# Patient Record
Sex: Male | Born: 1955
Health system: Southern US, Community
[De-identification: ages and names within clinical notes are randomized; demographics above are authoritative.]

## PROBLEM LIST (undated history)

## (undated) DIAGNOSIS — H53009 Unspecified amblyopia, unspecified eye: Secondary | ICD-10-CM

## (undated) DIAGNOSIS — E7800 Pure hypercholesterolemia, unspecified: Secondary | ICD-10-CM

## (undated) DIAGNOSIS — R42 Dizziness and giddiness: Secondary | ICD-10-CM

## (undated) DIAGNOSIS — K219 Gastro-esophageal reflux disease without esophagitis: Secondary | ICD-10-CM

## (undated) DIAGNOSIS — K2270 Barrett's esophagus without dysplasia: Secondary | ICD-10-CM

## (undated) DIAGNOSIS — N529 Male erectile dysfunction, unspecified: Secondary | ICD-10-CM

## (undated) DIAGNOSIS — Z973 Presence of spectacles and contact lenses: Secondary | ICD-10-CM

## (undated) HISTORY — DX: Gastro-esophageal reflux disease without esophagitis: K21.9

## (undated) HISTORY — PX: HAND SURGERY: SHX662

## (undated) HISTORY — DX: Male erectile dysfunction, unspecified: N52.9

## (undated) HISTORY — DX: Barrett's esophagus without dysplasia: K22.70

## (undated) HISTORY — DX: Pure hypercholesterolemia, unspecified: E78.00

---

## 1997-01-08 HISTORY — PX: ESOPHAGOGASTRODUODENOSCOPY: SHX1529

## 2001-03-08 ENCOUNTER — Encounter: Payer: Self-pay | Admitting: Family Medicine

## 2001-03-08 LAB — CONVERTED CEMR LAB
RBC count: 5 10*6/uL
TSH: 2.991 microintl units/mL

## 2001-03-09 ENCOUNTER — Encounter: Payer: Self-pay | Admitting: Family Medicine

## 2001-03-09 HISTORY — PX: OTHER SURGICAL HISTORY: SHX169

## 2001-03-09 LAB — CONVERTED CEMR LAB: Blood Glucose, Fasting: 85 mg/dL

## 2001-08-16 ENCOUNTER — Encounter: Payer: Self-pay | Admitting: Family Medicine

## 2001-08-16 LAB — CONVERTED CEMR LAB: Blood Glucose, Fasting: 89 mg/dL

## 2002-08-23 ENCOUNTER — Encounter: Payer: Self-pay | Admitting: Family Medicine

## 2002-08-23 LAB — CONVERTED CEMR LAB
RBC count: 4.91 10*6/uL
WBC, blood: 8 10*3/uL

## 2003-08-28 ENCOUNTER — Encounter: Payer: Self-pay | Admitting: Family Medicine

## 2003-08-28 LAB — CONVERTED CEMR LAB: Blood Glucose, Fasting: 102 mg/dL

## 2004-09-07 ENCOUNTER — Ambulatory Visit: Payer: Self-pay | Admitting: Family Medicine

## 2004-12-25 ENCOUNTER — Ambulatory Visit: Payer: Self-pay | Admitting: Family Medicine

## 2004-12-25 LAB — CONVERTED CEMR LAB
RBC count: 5 10*6/uL
WBC, blood: 10.1 10*3/uL

## 2004-12-28 ENCOUNTER — Encounter: Admission: RE | Admit: 2004-12-28 | Discharge: 2004-12-28 | Payer: Self-pay | Admitting: Family Medicine

## 2005-01-01 ENCOUNTER — Encounter: Admission: RE | Admit: 2005-01-01 | Discharge: 2005-01-01 | Payer: Self-pay | Admitting: Family Medicine

## 2005-01-06 ENCOUNTER — Ambulatory Visit (HOSPITAL_COMMUNITY): Admission: RE | Admit: 2005-01-06 | Discharge: 2005-01-06 | Payer: Self-pay | Admitting: Surgery

## 2005-01-06 HISTORY — PX: FEMORAL HERNIA REPAIR: SUR1179

## 2005-01-18 ENCOUNTER — Ambulatory Visit: Payer: Self-pay | Admitting: Family Medicine

## 2005-01-18 LAB — CONVERTED CEMR LAB
Blood Glucose, Fasting: 98 mg/dL
PSA: 0.32 ng/mL

## 2005-01-21 ENCOUNTER — Ambulatory Visit: Payer: Self-pay | Admitting: Family Medicine

## 2005-03-05 ENCOUNTER — Ambulatory Visit: Payer: Self-pay | Admitting: Family Medicine

## 2005-04-15 ENCOUNTER — Ambulatory Visit: Payer: Self-pay | Admitting: Family Medicine

## 2005-04-20 ENCOUNTER — Ambulatory Visit: Payer: Self-pay | Admitting: Family Medicine

## 2005-07-21 ENCOUNTER — Ambulatory Visit: Payer: Self-pay | Admitting: Family Medicine

## 2005-09-15 ENCOUNTER — Ambulatory Visit: Payer: Self-pay | Admitting: Family Medicine

## 2006-01-14 ENCOUNTER — Ambulatory Visit: Payer: Self-pay | Admitting: Family Medicine

## 2006-01-14 LAB — CONVERTED CEMR LAB: TSH: 2.04 microintl units/mL

## 2006-01-18 ENCOUNTER — Ambulatory Visit: Payer: Self-pay | Admitting: Family Medicine

## 2006-01-19 ENCOUNTER — Ambulatory Visit: Payer: Self-pay

## 2006-06-03 ENCOUNTER — Ambulatory Visit: Payer: Self-pay | Admitting: Family Medicine

## 2007-03-03 ENCOUNTER — Telehealth: Payer: Self-pay | Admitting: Family Medicine

## 2007-09-15 ENCOUNTER — Ambulatory Visit: Payer: Self-pay | Admitting: Family Medicine

## 2007-11-24 ENCOUNTER — Encounter: Payer: Self-pay | Admitting: Family Medicine

## 2007-11-24 DIAGNOSIS — K2270 Barrett's esophagus without dysplasia: Secondary | ICD-10-CM | POA: Insufficient documentation

## 2007-11-24 DIAGNOSIS — F528 Other sexual dysfunction not due to a substance or known physiological condition: Secondary | ICD-10-CM | POA: Insufficient documentation

## 2007-11-24 DIAGNOSIS — E7800 Pure hypercholesterolemia, unspecified: Secondary | ICD-10-CM | POA: Insufficient documentation

## 2007-11-29 ENCOUNTER — Ambulatory Visit: Payer: Self-pay | Admitting: Family Medicine

## 2007-11-29 LAB — CONVERTED CEMR LAB
BUN: 12 mg/dL (ref 6–23)
CO2: 29 meq/L (ref 19–32)
Calcium: 9.3 mg/dL (ref 8.4–10.5)
GFR calc Af Amer: 102 mL/min
GFR calc non Af Amer: 84 mL/min
LDL Cholesterol: 67 mg/dL (ref 0–99)
PSA: 0.31 ng/mL (ref 0.10–4.00)
Potassium: 4.1 meq/L (ref 3.5–5.1)
TSH: 2.32 microintl units/mL (ref 0.35–5.50)
Total CHOL/HDL Ratio: 2.7
Triglycerides: 57 mg/dL (ref 0–149)
VLDL: 11 mg/dL (ref 0–40)

## 2007-12-06 ENCOUNTER — Ambulatory Visit: Payer: Self-pay | Admitting: Family Medicine

## 2008-01-05 ENCOUNTER — Ambulatory Visit: Payer: Self-pay | Admitting: Family Medicine

## 2008-01-08 ENCOUNTER — Encounter (INDEPENDENT_AMBULATORY_CARE_PROVIDER_SITE_OTHER): Payer: Self-pay | Admitting: *Deleted

## 2008-03-26 ENCOUNTER — Ambulatory Visit: Payer: Self-pay | Admitting: Family Medicine

## 2009-05-15 ENCOUNTER — Telehealth: Payer: Self-pay | Admitting: Family Medicine

## 2009-08-18 ENCOUNTER — Encounter (INDEPENDENT_AMBULATORY_CARE_PROVIDER_SITE_OTHER): Payer: Self-pay | Admitting: *Deleted

## 2009-08-19 ENCOUNTER — Ambulatory Visit: Payer: Self-pay | Admitting: Family Medicine

## 2009-08-19 LAB — CONVERTED CEMR LAB
Alkaline Phosphatase: 46 units/L (ref 39–117)
Basophils Absolute: 0.1 10*3/uL (ref 0.0–0.1)
Bilirubin, Direct: 0 mg/dL (ref 0.0–0.3)
CO2: 29 meq/L (ref 19–32)
Calcium: 9.4 mg/dL (ref 8.4–10.5)
Creatinine, Ser: 1.2 mg/dL (ref 0.4–1.5)
GFR calc non Af Amer: 67.53 mL/min (ref >60)
HCT: 43.9 % (ref 39.0–52.0)
HDL: 46.1 mg/dL (ref >39.00)
Lymphs Abs: 3.1 10*3/uL (ref 0.7–4.0)
Monocytes Absolute: 0.5 10*3/uL (ref 0.1–1.0)
Monocytes Relative: 6.3 % (ref 3.0–12.0)
Neutrophils Relative %: 51.7 % (ref 43.0–77.0)
PSA: 0.34 ng/mL (ref 0.10–4.00)
Platelets: 241 10*3/uL (ref 150.0–400.0)
RDW: 12.1 % (ref 11.5–14.6)
Sodium: 142 meq/L (ref 135–145)
TSH: 1.8 microintl units/mL (ref 0.35–5.50)
Total Bilirubin: 0.7 mg/dL (ref 0.3–1.2)
Total CHOL/HDL Ratio: 3
Total Protein: 7.6 g/dL (ref 6.0–8.3)
Triglycerides: 101 mg/dL (ref 0.0–149.0)
VLDL: 20.2 mg/dL (ref 0.0–40.0)
WBC: 7.9 10*3/uL (ref 4.5–10.5)

## 2009-08-25 ENCOUNTER — Ambulatory Visit: Payer: Self-pay | Admitting: Family Medicine

## 2009-10-29 ENCOUNTER — Ambulatory Visit: Payer: Self-pay | Admitting: Family Medicine

## 2009-10-29 LAB — FECAL OCCULT BLOOD, GUAIAC: Fecal Occult Blood: NEGATIVE

## 2009-10-29 LAB — CONVERTED CEMR LAB: OCCULT 2: NEGATIVE

## 2009-10-30 ENCOUNTER — Encounter: Payer: Self-pay | Admitting: Family Medicine

## 2009-11-18 ENCOUNTER — Telehealth: Payer: Self-pay | Admitting: Family Medicine

## 2009-11-18 ENCOUNTER — Emergency Department (HOSPITAL_COMMUNITY): Admission: EM | Admit: 2009-11-18 | Discharge: 2009-11-19 | Payer: Self-pay | Admitting: Emergency Medicine

## 2009-12-02 HISTORY — PX: OTHER SURGICAL HISTORY: SHX169

## 2009-12-04 ENCOUNTER — Ambulatory Visit: Payer: Self-pay | Admitting: Cardiology

## 2009-12-04 DIAGNOSIS — R079 Chest pain, unspecified: Secondary | ICD-10-CM | POA: Insufficient documentation

## 2009-12-04 DIAGNOSIS — K219 Gastro-esophageal reflux disease without esophagitis: Secondary | ICD-10-CM | POA: Insufficient documentation

## 2009-12-09 ENCOUNTER — Telehealth (INDEPENDENT_AMBULATORY_CARE_PROVIDER_SITE_OTHER): Payer: Self-pay

## 2009-12-10 ENCOUNTER — Ambulatory Visit: Payer: Self-pay

## 2009-12-10 ENCOUNTER — Encounter (HOSPITAL_COMMUNITY): Admission: RE | Admit: 2009-12-10 | Discharge: 2010-02-04 | Payer: Self-pay | Admitting: Cardiovascular Disease

## 2009-12-10 ENCOUNTER — Ambulatory Visit: Payer: Self-pay | Admitting: Cardiovascular Disease

## 2009-12-17 ENCOUNTER — Ambulatory Visit: Payer: Self-pay | Admitting: Cardiology

## 2010-06-09 ENCOUNTER — Encounter (INDEPENDENT_AMBULATORY_CARE_PROVIDER_SITE_OTHER): Payer: Self-pay | Admitting: *Deleted

## 2010-12-01 NOTE — Assessment & Plan Note (Signed)
Summary: EA:VWUJW pain/pt seen in ed/records on file/lg   Visit Type:  Initial Consult Primary Provider:  Shaune Leeks MD  CC:  chest pain.  History of Present Illness: 55 yo with history of hyperlipidemia and strong family history of premature CAD presents for evaluation of chest pain.  Thomas Church was carrying a laser printer down a flight of stairs on a hand truck when he developed a cramp-like left-sided chest pain.  It gradually got worse and he became diaphoretic.  His left arm felt weak.  The pain resolved after about 5 minutes of rest.  He went to the ER, where cardiac enzymes were negative.  It was recommended that he stay overnight but he went home.  He has had no chest pain since that time.  He has excellent exercise tolerance with no exertional dyspnea.  Of note, he has had cramps in his chest in the past that were attributed to statin use and resolved with quinine.  This episode of pain was worse than anything he had had before.  He has actually been tolerating Vytorin without any problems.    ECG: NSR, normal  Labs (10/10): K 4.2, creatinine 1.2, LDL 75, HDL 46, TSH normal  Current Medications (verified): 1)  Vytorin 10-80 Mg Tabs (Ezetimibe-Simvastatin) .Marland Kitchen.. 1 Tablet At Bedtime 2)  Prilosec Otc 20 Mg  Tbec (Omeprazole Magnesium) .Marland Kitchen.. 1 Daily By Mouth  Allergies (verified): No Known Drug Allergies  Past History:  Past Medical History: 1. CHEST PAIN (ICD-786.50): exercise echo in 2002 was normal per patient's report.  2. HYPERCHOLESTEROLEMIA (ICD-272.0) 3. ACID REFLUX DISEASE (ICD-530.81) 4. ERECTILE DYSFUNCTION (ICD-302.72) 5. BARRETTS ESOPHAGUS (ICD-530.85)  Family History: Father: DECEASED 26 YOA MI; CABG X2 ; AAA VARICES//+HIV 2ND TO 1ST CABG.  Father's first MI was at age 54.  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   Vital Signs:  Patient profile:   55 year old male Height:      69 inches Weight:      202 pounds BMI:     29.94 Pulse rate:   74 / minute Pulse rhythm:   regular BP sitting:   120 / 78  (left arm)  Vitals Entered By: Laurance Flatten CMA (December 04, 2009 11:35 AM)  Physical Exam  General:  Well developed, well nourished, in no acute distress. Head:  normocephalic and atraumatic Nose:  no deformity, discharge, inflammation, or lesions Mouth:  Teeth, gums and palate normal. Oral mucosa normal. Neck:  Neck supple, no JVD. No masses, thyromegaly or abnormal cervical nodes. Lungs:  Clear bilaterally to auscultation and percussion. Heart:  Non-displaced PMI, chest non-tender; regular rate and rhythm, S1, S2 without murmurs, rubs or gallops. Carotid upstroke normal, no bruit. Pedals normal pulses. No edema, no varicosities. Abdomen:  Bowel sounds positive; abdomen soft and non-tender without masses, organomegaly, or hernias noted. No hepatosplenomegaly. Msk:  Back normal, normal gait. Muscle strength and tone normal. Extremities:  No clubbing or cyanosis. Neurologic:  Alert and oriented x 3. Skin:  Intact without lesions or rashes. Psych:  Normal affect.   Impression & Recommendations:  Problem # 1:  CHEST PAIN (ICD-786.50) Patient had an episode of "chest cramping" while maneuvering a laser printer on a handtruck down a flight of steps.  This certainly could have been musculoskeletal and has not been repeated since.  His ECG is normal.  However, given his strong family history of premature CAD (father had  his first MI at 47), I think that we are certainly obligated to look for evidence of coronary ischemia.  I will set him up for an ETT-myoview.  He should continue his Vytorin and start on ASA 81 mg daily.    Other Orders: Nuclear Stress Test (Nuc Stress Test)  Patient Instructions: 1)  Your physician recommends that you schedule a follow-up appointment in: 2 weeks 2)  Your physician has recommended you make the following change in your medication: please take 81mg  asa everyday 3)  Your physician has requested that you have an exercise stress myoview.  For further information please visit [email or web address]  Please follow instruction sheet, as given.

## 2010-12-01 NOTE — Progress Notes (Signed)
Summary: Left arm pain and weakness  Phone Note Call from Patient Call back at [phone]   Caller: Patient Call For: Dr. Milinda Antis Summary of Call: Pt said at 2:00pm today had very painful cramp in upper muscle of left arm. Left arm got weak, pt was flushed and lightheaded. Pt was not sure if had chest pain or not. pt had not eaten all day. Pt ate at 3pm withno real differnce in how he felt. Now pt's left arm feels very sore and still feels weak. Pt appears pale and washed out in face. Pt's wife said pt not talking like himself and pt said that was because he had congestion in his throat. Dr Nandini Bogdanski said for pt to go to ER for evaluation. Pt and pt's wife said they would go to [facility] ER now. Initial call taken by: Lewanda Rife LPN,  November 18, 2009 5:05 PM

## 2010-12-01 NOTE — Assessment & Plan Note (Signed)
Summary: 2WK F/U    Primary Provider:  Shaune Leeks MD  CC:  pt follow up on nuclear test.  He is not taking an ASA everyday due to indigestion.  Marland Kitchen  History of Present Illness: 55 yo with history of hyperlipidemia and strong family history of premature CAD returns for evaluation of chest pain.  Thomas Church was carrying a laser printer down a flight of stairs on a hand truck when he developed a cramp-like left-sided chest pain.  It gradually got worse and he became diaphoretic.  His left arm felt weak.  The pain resolved after about 5 minutes of rest.  He went to the ER, where cardiac enzymes were negative.  It was recommended that he stay overnight but he went home.  He has had no chest pain since that time.  He has excellent exercise tolerance with no exertional dyspnea.  Of note, he has had cramps in his chest in the past that were attributed to statin use and resolved with quinine.  This episode of pain was worse than anything he had had before.  He has actually been tolerating Vytorin without any problems.    He did an ETT-myoview on which he exercised for 8'35" with no chest pain.  He stopped due to fatigue.  No ischemia or infarction.    Labs (10/10): K 4.2, creatinine 1.2, LDL 75, HDL 46, TSH normal  Current Medications (verified): 1)  Vytorin 10-80 Mg Tabs (Ezetimibe-Simvastatin) .Marland Kitchen.. 1 Tablet At Bedtime 2)  Prilosec Otc 20 Mg  Tbec (Omeprazole Magnesium) .Marland Kitchen.. 1 Daily By Mouth  Allergies (verified): No Known Drug Allergies  Past History:  Past Medical History: 1. CHEST PAIN (ICD-786.50): exercise echo in 2002 was normal per patient's report.  ETT-myoview (2/11): 8'35", no chest pain, no ischemic ECG changes, perfusion images showed no ischemia or infarction.  2. HYPERCHOLESTEROLEMIA (ICD-272.0) 3. ACID REFLUX DISEASE (ICD-530.81) 4. ERECTILE DYSFUNCTION (ICD-302.72) 5. BARRETTS ESOPHAGUS (ICD-530.85)  Family History: Reviewed history from 12/04/2009 and no changes  required. Father: DECEASED 62 YOA MI; CABG X2 ; AAA VARICES//+HIV 2ND TO 1ST CABG.  Father's first MI was at age 5.  Mother: A 2  GIANT CELL  ARTERITIS   CATARACTS BROTHER  A 55 Colitis CV: + FATHER CABG X2 ;MI HBP: + FATHER DM: + UNCLE GOUT/ARTHRITIS: PROSTATE CANCER:  BREAST CANCER: + AUNT OVARIAN/UTERINE CANCER: NEGATIVE COLON CANCER: DEPRESSION: NEGATIVE ETOH ABUSE: + UNCLE OTHER: + STROKE ; GREAT AUNT  Social History: Reviewed history from 12/04/2009 and no changes required. 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 Neck:  Neck supple, no JVD. No masses, thyromegaly or abnormal cervical nodes. Lungs:  Clear bilaterally to auscultation and percussion. Heart:  Non-displaced PMI, chest non-tender; regular rate and rhythm, S1, S2 without murmurs, rubs or gallops. Carotid upstroke normal, no bruit. Pedals normal pulses. No edema, no varicosities. Abdomen:  Bowel sounds positive; abdomen soft and  non-tender without masses, organomegaly, or hernias noted. No hepatosplenomegaly. Extremities:  No clubbing or cyanosis. Neurologic:  Alert and oriented x 3. Psych:  Normal affect.   Impression & Recommendations:  Problem # 1:  CHEST PAIN (ICD-786.50) I suspect that this was noncardiac.  It may have been due to a muscle cramp while trying to move heavy machinery.  Myoview was low risk with no evidence of ischemia and infarction.  He needs to take ASA 81 mg daily.  I recommended that he take it with food as it causes  discomfort when he takes it on an empty stomach.    Patient Instructions: 1)  We will see you back on an as needed basis.

## 2010-12-01 NOTE — Letter (Signed)
Summary: Nadara Eaton letter  [place] at [facility]  [address] [place], [place] [address]   Phone: [phone]  Fax: [phone]       06/09/2010 MRN: [id number]  [facility] [address] [place], [place]  [address]  Dear Mr. [facility] [place] and [facility] announce the retirement of Arta Silence, M.D., from full-time practice at the [facility] office effective April 30, 2010 and his plans of returning part-time.  It is important to Dr. Hetty Ely and to our practice that you understand that [facility] has seven physicians in our office for your health care needs.  We will continue to offer the same exceptional care that you have today.    Dr. Hetty Ely has spoken to many of you about his plans for retirement and returning part-time in the fall.   We will continue to work with you through the transition to schedule appointments for you in the office and meet the high standards that [facility] is committed to.   Again, it is with great pleasure that we share the news that Dr. Hetty Ely will return to [facility] in October of 2011 with a reduced schedule.    If you have any questions, or would like to request an appointment with one of our physicians, please call us at [phone] and press the option for Scheduling an appointment.  We take pleasure in providing you with excellent patient care and look forward to seeing you at your next office visit.  Our [facility] Physicians are:  Tillman Abide, M.D. Laurita Quint, M.D. Roxy Manns, M.D. Kerby Nora, M.D. Hannah Beat, M.D. Ruthe Mannan, M.D. We proudly welcomed Raechel Ache, M.D. and Eustaquio Boyden, M.D. to the practice in July/August 2011.  Sincerely,  [facility]

## 2010-12-01 NOTE — Progress Notes (Signed)
Summary: Nuc. Pre-Procedure  Phone Note Outgoing Call Call back at [phone]   Call placed by: Irean Hong, RN,  December 09, 2009 2:42 PM Summary of Call: Reviewed information on Myoview Information Sheet (see scanned document for further details).  Spoke with patient.     Nuclear Med Background Indications for Stress Test: Evaluation for Ischemia  Indications Comments: 11/18/09 ED: CP, (-) enzymes.  History: Echo  History Comments: 6/02 Stress Echo NL.  Symptoms: Chest Pain with Exertion, Diaphoresis    Nuclear Pre-Procedure Cardiac Risk Factors: Carotid Disease, Family History - CAD, Lipids Height (in): 69

## 2010-12-01 NOTE — Assessment & Plan Note (Signed)
Summary: Cardiology Nuclear Study  Nuclear Med Background Indications for Stress Test: Evaluation for Ischemia  Indications Comments: 11/18/09 ED: CP, (-) enzymes.  History: Echo  History Comments: 6/02 Stress Echo NL.  Symptoms: Chest Pain with Exertion, Diaphoresis    Nuclear Pre-Procedure Cardiac Risk Factors: Carotid Disease, Family History - CAD, Lipids Caffeine/Decaff Intake: None NPO After: 9:30 PM Lungs: clear IV 0.9% NS with Angio Cath: 18g     IV Site: (R) AC IV Started by: Stanton Kidney EMT-P Chest Size (in) 44     Height (in): 69 Weight (lb): 200 BMI: 29.64  Nuclear Med Study 1 or 2 day study:  1 day     Stress Test Type:  Stress Reading MD:  Charlton Haws, MD     Referring MD:  D.Mclean Resting Radionuclide:  Technetium 56m Tetrofosmin     Resting Radionuclide Dose:  11.0 mCi  Stress Radionuclide:  Technetium 67m Tetrofosmin     Stress Radionuclide Dose:  33.0 mCi   Stress Protocol Exercise Time (min):  8:35 min     Max HR:  176 bpm     Predicted Max HR:  168 bpm  Max Systolic BP: 177 mm Hg     Percent Max HR:  104.76 %     METS: 10.4 Rate Pressure Product:  [address]    Stress Test Technologist:  Milana Na EMT-P     Nuclear Technologist:  Burna Mortimer Deal RT-N  Rest Procedure  Myocardial perfusion imaging was performed at rest 45 minutes following the intravenous administration of Myoview Technetium 84m Tetrofosmin.  Stress Procedure  The patient exercised for 8:35. The patient stopped due to fatigue, leg pain, and denied any chest pain.  There were no significant ST-T wave changes.  Myoview was injected at peak exercise and myocardial perfusion imaging was performed after a brief delay.  QPS Raw Data Images:  Normal; no motion artifact; normal heart/lung ratio. Stress Images:  NI: Uniform and normal uptake of tracer in all myocardial segments. Rest Images:  Normal homogeneous uptake in all areas of the myocardium. Subtraction (SDS):  Normal Transient  Ischemic Dilatation:  .91  (Normal <1.22)  Lung/Heart Ratio:  .29  (Normal <0.45)  Quantitative Gated Spect Images QGS EDV:  72 ml QGS ESV:  27 ml QGS EF:  62 % QGS cine images:  normal  Findings Normal nuclear study      Overall Impression  Exercise Capacity: Good exercise capacity. BP Response: Normal blood pressure response. Clinical Symptoms: Leg pain ECG Impression: No significant ST segment change suggestive of ischemia. Overall Impression: Normal stress nuclear study. Overall Impression Comments: Normal  Appended Document: Cardiology Nuclear Study normal study  Appended Document: Cardiology Nuclear Study pt aware./cy

## 2010-12-10 ENCOUNTER — Encounter: Payer: Self-pay | Admitting: Family Medicine

## 2010-12-10 ENCOUNTER — Ambulatory Visit (INDEPENDENT_AMBULATORY_CARE_PROVIDER_SITE_OTHER): Payer: BC Managed Care – PPO | Admitting: Family Medicine

## 2010-12-10 DIAGNOSIS — R252 Cramp and spasm: Secondary | ICD-10-CM

## 2010-12-10 DIAGNOSIS — R635 Abnormal weight gain: Secondary | ICD-10-CM

## 2010-12-10 DIAGNOSIS — K219 Gastro-esophageal reflux disease without esophagitis: Secondary | ICD-10-CM

## 2010-12-10 DIAGNOSIS — E7800 Pure hypercholesterolemia, unspecified: Secondary | ICD-10-CM

## 2010-12-17 NOTE — Assessment & Plan Note (Signed)
Summary: REFILL MEDICATION/CLE   [employer]   Vital Signs:  Patient profile:   55 year old male Weight:      206.75 pounds BMI:     30.64 Temp:     98.0 degrees F oral Pulse rate:   76 / minute Pulse rhythm:   regular BP sitting:   110 / 70  (left arm) Cuff size:   large  Vitals Entered By: Sydell Axon LPN (December 10, 2010 10:13 AM) CC: Refill medicaiton, fasting for lab work   History of Present Illness: Thomas Church here for med reefill. He hasn't had PE since 10/10. He gets cramps occas, had signif episode in chesat and was evaluated for chest pain.  He gets winded sooner.  He has no GERD if taking his Prilosec regularly.  Problems Prior to Update: 1)  Chest Pain  (ICD-786.50) 2)  Hypercholesterolemia  (ICD-272.0) 3)  Acid Reflux Disease  (ICD-530.81) 4)  Special Screening Malig Neoplasms Other Sites  (ICD-V76.49) 5)  Health Maintenance Exam  (ICD-V70.0) 6)  Special Screening Malignant Neoplasm of Prostate  (ICD-V76.44) 7)  Erectile Dysfunction  (ICD-302.72) 8)  Barretts Esophagus  (ICD-530.85)  Medications Prior to Update: 1)  Vytorin 10-80 Mg Tabs (Ezetimibe-Simvastatin) .Marland Kitchen.. 1 Tablet At Bedtime 2)  Prilosec Otc 20 Mg  Tbec (Omeprazole Magnesium) .Marland Kitchen.. 1 Daily By Mouth  Current Medications (verified): 1)  Vytorin 10-80 Mg Tabs (Ezetimibe-Simvastatin) .Marland Kitchen.. 1 Tablet At Bedtime 2)  Prilosec Otc 20 Mg  Tbec (Omeprazole Magnesium) .Marland Kitchen.. 1 Daily By Mouth  Allergies: No Known Drug Allergies  Physical Exam  General:  Well-developed,well-nourished,in no acute distress; alert,appropriate and cooperative throughout examination, mildly overweight. Head:  Normocephalic and atraumatic without obvious abnormalities. No apparent alopecia or balding. Sinuses nontender. Eyes:  Conjunctiva clear bilaterally.  Ears:  External ear exam shows no significant lesions or deformities.  Otoscopic examination reveals clear canals, tympanic membranes are intact bilaterally without bulging, retraction,  inflammation or discharge. Hearing is grossly normal bilaterally. Nose:  External nasal examination shows no deformity or inflammation. Nasal mucosa are pink and moist without lesions or exudates. Mucose membr nml. Mouth:  Oral mucosa and oropharynx without lesions or exudates.  Teeth in good repair. 2+ tonsils w/o inflamm. Neck:  No deformities, masses, or tenderness noted. Lungs:  Normal respiratory effort, chest expands symmetrically. Lungs are clear to auscultation, no crackles or wheezes. Heart:  Normal rate and regular rhythm. S1 and S2 normal without gallop, murmur, click, rub or other extra sounds.   Impression & Recommendations:  Problem # 1:  HYPERCHOLESTEROLEMIA (ICD-272.0) Assessment Unchanged  Refilled med. Make appt for PE and will check labs then. Tolerating well. His updated medication list for this problem includes:    Vytorin 10-80 Mg Tabs (Ezetimibe-simvastatin) .Marland Kitchen... 1 tablet at bedtime  Labs Reviewed: SGOT: 25 (08/19/2009)   SGPT: 35 (08/19/2009)   HDL:46.10 (08/19/2009), 46.5 (11/29/2007)  LDL:75 (08/19/2009), 67 (11/29/2007)  Chol:141 (08/19/2009), 125 (11/29/2007)  Trig:101.0 (08/19/2009), 57 (11/29/2007)  Problem # 2:  ACID REFLUX DISEASE (ICD-530.81) Assessment: Unchanged Stable on meds. Jogging in jug may help. His updated medication list for this problem includes:    Prilosec Otc 20 Mg Tbec (Omeprazole magnesium) .Marland Kitchen... 1 daily by mouth  Problem # 3:  MUSCLE CRAMPS (ICD-729.82) Assessment: New Try jogging in jug. Will check labs in future.  Problem # 4:  WEIGHT GAIN (ICD-783.1) Assessment: New Is on [place]. Discussed parameters of protein intake. Encouraged exercise.  Complete Medication List: 1)  Vytorin 10-80 Mg Tabs (Ezetimibe-simvastatin) .Marland KitchenMarland KitchenMarland Kitchen  1 tablet at bedtime 2)  Prilosec Otc 20 Mg Tbec (Omeprazole magnesium) .Marland Kitchen.. 1 daily by mouth  Patient Instructions: 1)  RTC for PE at next convenient appt.  2)  Look into jogging in jug.   Prescriptions: VYTORIN 10-80 MG TABS (EZETIMIBE-SIMVASTATIN) 1 tablet at bedtime  #30 Tablet x 12   Entered and Authorized by:   Shaune Leeks MD   Signed by:   Shaune Leeks MD on 12/10/2010   Method used:   Electronically to        [facility]* (retail)       [address]       [place], [place]  [address]       Ph: [phone]       Fax: [phone]   RxID:   [id number]    Orders Added: 1)  Est. Patient Level III [84132]    Current Allergies (reviewed today): No known allergies

## 2011-01-17 LAB — DIFFERENTIAL
Basophils Absolute: 0.1 10*3/uL (ref 0.0–0.1)
Basophils Relative: 1 % (ref 0–1)
Eosinophils Relative: 1 % (ref 0–5)
Monocytes Absolute: 0.5 10*3/uL (ref 0.1–1.0)

## 2011-01-17 LAB — POCT I-STAT, CHEM 8
Calcium, Ion: 1.18 mmol/L (ref 1.12–1.32)
HCT: 48 % (ref 39.0–52.0)
Hemoglobin: 16.3 g/dL (ref 13.0–17.0)
TCO2: 30 mmol/L (ref 0–100)

## 2011-01-17 LAB — CBC
HCT: 46.3 % (ref 39.0–52.0)
Hemoglobin: 15.8 g/dL (ref 13.0–17.0)
MCHC: 34.2 g/dL (ref 30.0–36.0)
Platelets: 249 10*3/uL (ref 150–400)
RDW: 12.5 % (ref 11.5–15.5)

## 2011-01-17 LAB — POCT CARDIAC MARKERS: Troponin i, poc: <0.05 ng/mL (ref 0.00–0.09)

## 2011-03-06 ENCOUNTER — Encounter: Payer: Self-pay | Admitting: Family Medicine

## 2011-03-19 NOTE — Op Note (Signed)
Thomas Church, Thomas Church                  ACCOUNT NO.:  [id number]   MEDICAL RECORD NO.:  [id number]          PATIENT TYPE:  OIB   LOCATION:  2899                         FACILITY:  [facility]   PHYSICIAN:  Thornton Park. Daphine Deutscher, MD  DATE OF BIRTH:  December 07, 1955   DATE OF PROCEDURE:  DATE OF DISCHARGE:                                 OPERATIVE REPORT   PREOPERATIVE DIAGNOSIS:  Left inguinal hernia.   POSTOPERATIVE DIAGNOSIS:  Left incarcerated femoral hernia containing fat.   SURGEON:  Thornton Park. Daphine Deutscher, M.D.   ANESTHESIA:  General.   DRAINS:  None.   DESCRIPTION OF PROCEDURE:  Thomas Church is a 55 year old man who had been seen  over a several-month period with recurrent complaints of fullness in his  left groin and sometimes pinching.  There was no obvious direct hernia, but  it was felt he might have a hernia at the ring.  Informed consent was  obtained regarding this repair and potential numbness or nerve pain.  He was  taken to O.R. 17 at [facility] on January 06, 2005 and given general  anesthesia.  He received Ancef preoperatively.  The abdomen was prepped with  Betadine and draped sterilely.  A small oblique incision was made, carried  down through a fairly generous fatty layer to the external oblique.  These  fibers were opened along the way.  They were splayed open somewhat.  Upon  getting into the canal, I mobilized the cord and did not see an indirect  hernia sac.  I was, however, surprised to see the large bulging mass below  the inguinal ligament consistent with probably a femoral hernia.  I went  ahead and dissected the inguinal region, including the floor, and then  incised the transversalis fascia, and then beginning massaging from below  and above, I completely reduced this femoral hernia.   I cut a piece of mesh and rolled it and sutured it to itself and then pulled  that through this defect, and I tacked it to Cooper's ligament with a  __________.  I then put a single stitch  medial to that, approximating the  Cooper's ligament and inguinal ligaments.   I then closed the transversalis with running 2-0 Prolene.  I repaired the  floor with a piece of Marlex mesh cut to fit the floor, sutured along the  inguinal ligament with running 2-0 Prolene, and medially and above with a  running 2-0 Prolene.  It was then crossed over around the cord and sutured  to itself with a single horizontal mattress suture of 2-0 Prolene.  This was  tucked beneath the external oblique which was then closed with a running 2-0  Vicryl.  It was injected with 0.5% Marcaine.  4-0 Vicryl was used in the  subcutaneous tissue and subcuticularly, followed by a  running subcuticular 5-0 Vicryl and Dermabond.  The patient seemed to  tolerate the procedure well.  He will be given Percocet to take for pain,  and will be followed up in the office in about three weeks.   FINAL DIAGNOSIS:  Incarcerated  left femoral hernia.      MBM/MEDQ  D:  01/06/2005  T:  01/06/2005  Job:  [id number]   cc:   Laurita Quint, M.D.  [address] [place]  [place] [address]  Fax: [phone]

## 2011-03-25 ENCOUNTER — Other Ambulatory Visit (INDEPENDENT_AMBULATORY_CARE_PROVIDER_SITE_OTHER): Payer: BC Managed Care – PPO | Admitting: Family Medicine

## 2011-03-25 ENCOUNTER — Other Ambulatory Visit: Payer: Self-pay | Admitting: Family Medicine

## 2011-03-25 DIAGNOSIS — R252 Cramp and spasm: Secondary | ICD-10-CM

## 2011-03-25 DIAGNOSIS — K2270 Barrett's esophagus without dysplasia: Secondary | ICD-10-CM

## 2011-03-25 DIAGNOSIS — E7800 Pure hypercholesterolemia, unspecified: Secondary | ICD-10-CM

## 2011-03-25 DIAGNOSIS — Z79899 Other long term (current) drug therapy: Secondary | ICD-10-CM

## 2011-03-25 DIAGNOSIS — R635 Abnormal weight gain: Secondary | ICD-10-CM

## 2011-03-25 DIAGNOSIS — K219 Gastro-esophageal reflux disease without esophagitis: Secondary | ICD-10-CM

## 2011-03-25 LAB — HEPATIC FUNCTION PANEL
ALT: 32 U/L (ref 0–53)
Bilirubin, Direct: 0.1 mg/dL (ref 0.0–0.3)
Total Protein: 7.3 g/dL (ref 6.0–8.3)

## 2011-03-25 LAB — LIPID PANEL
Cholesterol: 239 mg/dL — ABNORMAL HIGH (ref 0–200)
HDL: 55.6 mg/dL (ref >39.00)
Triglycerides: 80 mg/dL (ref 0.0–149.0)

## 2011-03-25 LAB — CBC WITH DIFFERENTIAL/PLATELET
Basophils Absolute: 0.1 10*3/uL (ref 0.0–0.1)
Eosinophils Absolute: 0.2 10*3/uL (ref 0.0–0.7)
Eosinophils Relative: 1.9 % (ref 0.0–5.0)
MCHC: 33.8 g/dL (ref 30.0–36.0)
MCV: 90.8 fl (ref 78.0–100.0)
Monocytes Absolute: 0.6 10*3/uL (ref 0.1–1.0)
Neutrophils Relative %: 54.5 % (ref 43.0–77.0)
Platelets: 233 10*3/uL (ref 150.0–400.0)
WBC: 7.8 10*3/uL (ref 4.5–10.5)

## 2011-03-25 LAB — VITAMIN B12: Vitamin B-12: 255 pg/mL (ref 211–911)

## 2011-03-25 LAB — RENAL FUNCTION PANEL
Calcium: 9.4 mg/dL (ref 8.4–10.5)
Creatinine, Ser: 1.1 mg/dL (ref 0.4–1.5)
Glucose, Bld: 100 mg/dL — ABNORMAL HIGH (ref 70–99)
Sodium: 139 mEq/L (ref 135–145)

## 2011-03-25 LAB — MAGNESIUM: Magnesium: 2.2 mg/dL (ref 1.5–2.5)

## 2011-04-01 ENCOUNTER — Encounter: Payer: Self-pay | Admitting: Family Medicine

## 2011-04-01 ENCOUNTER — Ambulatory Visit (INDEPENDENT_AMBULATORY_CARE_PROVIDER_SITE_OTHER): Payer: BC Managed Care – PPO | Admitting: Family Medicine

## 2011-04-01 DIAGNOSIS — R635 Abnormal weight gain: Secondary | ICD-10-CM

## 2011-04-01 DIAGNOSIS — Z0000 Encounter for general adult medical examination without abnormal findings: Secondary | ICD-10-CM

## 2011-04-01 DIAGNOSIS — E7800 Pure hypercholesterolemia, unspecified: Secondary | ICD-10-CM

## 2011-04-01 DIAGNOSIS — K219 Gastro-esophageal reflux disease without esophagitis: Secondary | ICD-10-CM

## 2011-04-01 DIAGNOSIS — Z79899 Other long term (current) drug therapy: Secondary | ICD-10-CM

## 2011-04-01 DIAGNOSIS — R252 Cramp and spasm: Secondary | ICD-10-CM

## 2011-04-01 NOTE — Progress Notes (Signed)
  Subjective:    Patient ID: Thomas Church, male    DOB: October 31, 1956, 55 y.o.   MRN: [id number]  HPI Pt here for Comp Exam. He feels well with no complaints except for a spot on his abd and back he would like looked at. He has flashing in his eye/eyes that lasts and goes away. It is happening more frequently, started a few years ago. He went a month without taking his chol medication. His mother had died and things got complicated.    Review of Systems  Constitutional: Negative for fever, chills, diaphoresis, appetite change, fatigue and unexpected weight change.  HENT: Negative for hearing loss, ear pain, tinnitus and ear discharge.   Eyes: Negative for pain, discharge and visual disturbance.       See HPI.  Respiratory: Negative for cough, shortness of breath and wheezing.   Cardiovascular: Negative for chest pain and palpitations.       No SOB w/ exertion  Gastrointestinal: Negative for nausea, vomiting, abdominal pain, diarrhea, constipation and blood in stool.       No heartburn or swallowing problems.  Genitourinary: Negative for dysuria, frequency and difficulty urinating.       No nocturia  Musculoskeletal: Positive for arthralgias. Negative for myalgias and back pain.  Skin: Negative for rash.       No itching or dryness.  Neurological: Negative for tremors and numbness.       No tingling or balance problems.  Hematological: Negative for adenopathy. Does not bruise/bleed easily.  Psychiatric/Behavioral: Negative for dysphoric mood and agitation.       Objective:   Physical Exam  Constitutional: He is oriented to person, place, and time. He appears well-developed and well-nourished. No distress.  HENT:  Head: Normocephalic and atraumatic.  Right Ear: External ear normal.  Left Ear: External ear normal.  Nose: Nose normal.  Mouth/Throat: Oropharynx is clear and moist.  Eyes: Conjunctivae and EOM are normal. Pupils are equal, round, and reactive to light. Right eye  exhibits no discharge. Left eye exhibits no discharge. No scleral icterus.  Neck: Normal range of motion. Neck supple. No thyromegaly present.  Cardiovascular: Normal rate, regular rhythm, normal heart sounds and intact distal pulses.   No murmur heard. Pulmonary/Chest: Effort normal and breath sounds normal. No respiratory distress. He has no wheezes.  Abdominal: Soft. Bowel sounds are normal. He exhibits no distension and no mass. There is no tenderness. There is no rebound and no guarding.  Genitourinary: Rectum normal, prostate normal and penis normal. Guaiac negative stool.       Prostate 30gms, firm, symmetric and smooth   Musculoskeletal: Normal range of motion. He exhibits no edema.  Lymphadenopathy:    He has no cervical adenopathy.  Neurological: He is alert and oriented to person, place, and time. Coordination normal.  Skin: Skin is warm and dry. No rash noted. He is not diaphoretic.       Benign mole on lower back and lower abd.  Psychiatric: He has a normal mood and affect. His behavior is normal. Judgment and thought content normal.          Assessment & Plan:  HMPE

## 2011-04-01 NOTE — Assessment & Plan Note (Signed)
Improved. Cont as is.

## 2011-04-01 NOTE — Assessment & Plan Note (Signed)
Discussed cutting down in order to lose weight.

## 2011-04-01 NOTE — Assessment & Plan Note (Signed)
LDL high with having been off meds. Get back on them, which he has done. Recheck next year.

## 2011-04-01 NOTE — Assessment & Plan Note (Signed)
Does well as long as on his medication. Discussed dietary approach.

## 2011-04-01 NOTE — Patient Instructions (Addendum)
GERD can be treated with medication but also with lifestyle changes including avoidance of late meals, excess alcohol, smoking cessation, avoiding spicy foods, chocolate, peppermint, colas, red wine and  acidic juices such as orange or tomato juice. Do not take mint or menthol products, use sugarless candy instead (Jolly Ranchers) See opthalmologist for the flashing of the eyes since it is getting more frequent.

## 2011-04-16 ENCOUNTER — Other Ambulatory Visit: Payer: Self-pay | Admitting: Family Medicine

## 2011-04-16 ENCOUNTER — Other Ambulatory Visit: Payer: BC Managed Care – PPO

## 2011-04-16 DIAGNOSIS — Z1211 Encounter for screening for malignant neoplasm of colon: Secondary | ICD-10-CM

## 2011-04-16 DIAGNOSIS — Z0000 Encounter for general adult medical examination without abnormal findings: Secondary | ICD-10-CM

## 2011-04-16 LAB — FECAL OCCULT BLOOD, IMMUNOCHEMICAL: Fecal Occult Bld: NEGATIVE

## 2011-04-21 ENCOUNTER — Encounter: Payer: Self-pay | Admitting: *Deleted

## 2011-07-16 ENCOUNTER — Encounter: Payer: Self-pay | Admitting: Family Medicine

## 2011-07-16 ENCOUNTER — Ambulatory Visit (INDEPENDENT_AMBULATORY_CARE_PROVIDER_SITE_OTHER): Payer: BC Managed Care – PPO | Admitting: Family Medicine

## 2011-07-16 VITALS — BP 110/70 | HR 84 | Temp 98.5°F | Wt 206.8 lb

## 2011-07-16 DIAGNOSIS — J069 Acute upper respiratory infection, unspecified: Secondary | ICD-10-CM

## 2011-07-16 DIAGNOSIS — J029 Acute pharyngitis, unspecified: Secondary | ICD-10-CM

## 2011-07-16 LAB — POCT RAPID STREP A (OFFICE): Rapid Strep A Screen: NEGATIVE

## 2011-07-16 NOTE — Assessment & Plan Note (Signed)
Allergy flare vs viral URTI. Treat with nasal saline irrigation, OTC antihistamine. Update if going on past 10 days, fever >101.5, or worsening instead of improving. Pt agrees with plan. RST neg.

## 2011-07-16 NOTE — Progress Notes (Signed)
  Subjective:    Patient ID: Thomas Church, male    DOB: 1956-08-30, 55 y.o.   MRN: [id number]  HPI CC: ST, congestion  5d ago started with sniffles (started after mowing grass).  RN, congestion, sneezing, watery eyes.  Lots of PNDrainage.  Yesterday ST, worsening, trouble sleeping.  Mild cough.  + achey this am.  Ibuprofen helped.  Took chest congestion med as well.  More congested in nasal area.  epistaxis x1 after sneezing, quickly resolved.  No fevers/chills, abd pain, n/v/d, rashes, HA, ear pain or tooth pain.  No smokers at home.  No sick contacts.  No h/o asthma/allergies/COPD.  Review of Systems Per HPI    Objective:   Physical Exam  Nursing note and vitals reviewed. Constitutional: He appears well-developed and well-nourished. No distress.  HENT:  Head: Normocephalic and atraumatic.  Right Ear: Hearing, tympanic membrane, external ear and ear canal normal.  Left Ear: Hearing, tympanic membrane, external ear and ear canal normal.  Nose: Nose normal. No mucosal edema or rhinorrhea. Right sinus exhibits no maxillary sinus tenderness and no frontal sinus tenderness. Left sinus exhibits no maxillary sinus tenderness and no frontal sinus tenderness.  Mouth/Throat: Uvula is midline and mucous membranes are normal. Posterior oropharyngeal erythema present. No oropharyngeal exudate, posterior oropharyngeal edema or tonsillar abscesses.  Eyes: Conjunctivae and EOM are normal. Pupils are equal, round, and reactive to light. No scleral icterus.  Neck: Normal range of motion. Neck supple. No thyromegaly present.  Cardiovascular: Normal rate, regular rhythm, normal heart sounds and intact distal pulses.   No murmur heard. Pulmonary/Chest: Effort normal and breath sounds normal. No respiratory distress. He has no wheezes. He has no rales.  Lymphadenopathy:    He has no cervical adenopathy.  Skin: Skin is warm and dry. No rash noted.          Assessment & Plan:

## 2011-07-16 NOTE — Patient Instructions (Signed)
Sounds like you have a viral upper respiratory infection. Antibiotics are not needed for this.  Viral infections usually take 7-10 days to resolve.  The cough can last several weeks to go away. Push fluids and plenty of rest. Please return if you are not improving as expected, or if you have high fevers (>101.5) or difficulty swallowing or worsening productive cough or going on past 10 days. Call clinic with questions.  Pleasure to see you today.  May use ibuprofen for throat inflammation. Salt water gargles. Suck on cold things like popsicles or warm things like herbal teas (whichever soothes the throat better).

## 2011-10-07 ENCOUNTER — Ambulatory Visit (INDEPENDENT_AMBULATORY_CARE_PROVIDER_SITE_OTHER): Payer: BC Managed Care – PPO

## 2011-10-07 DIAGNOSIS — Z23 Encounter for immunization: Secondary | ICD-10-CM

## 2011-11-13 IMAGING — CR DG CHEST 2V
2 series · 2 of 2 positions shown · non-contrast
Comparison: None

CLINICAL DATA: Chest pain.

CHEST - 2 VIEW

[w chest pa]
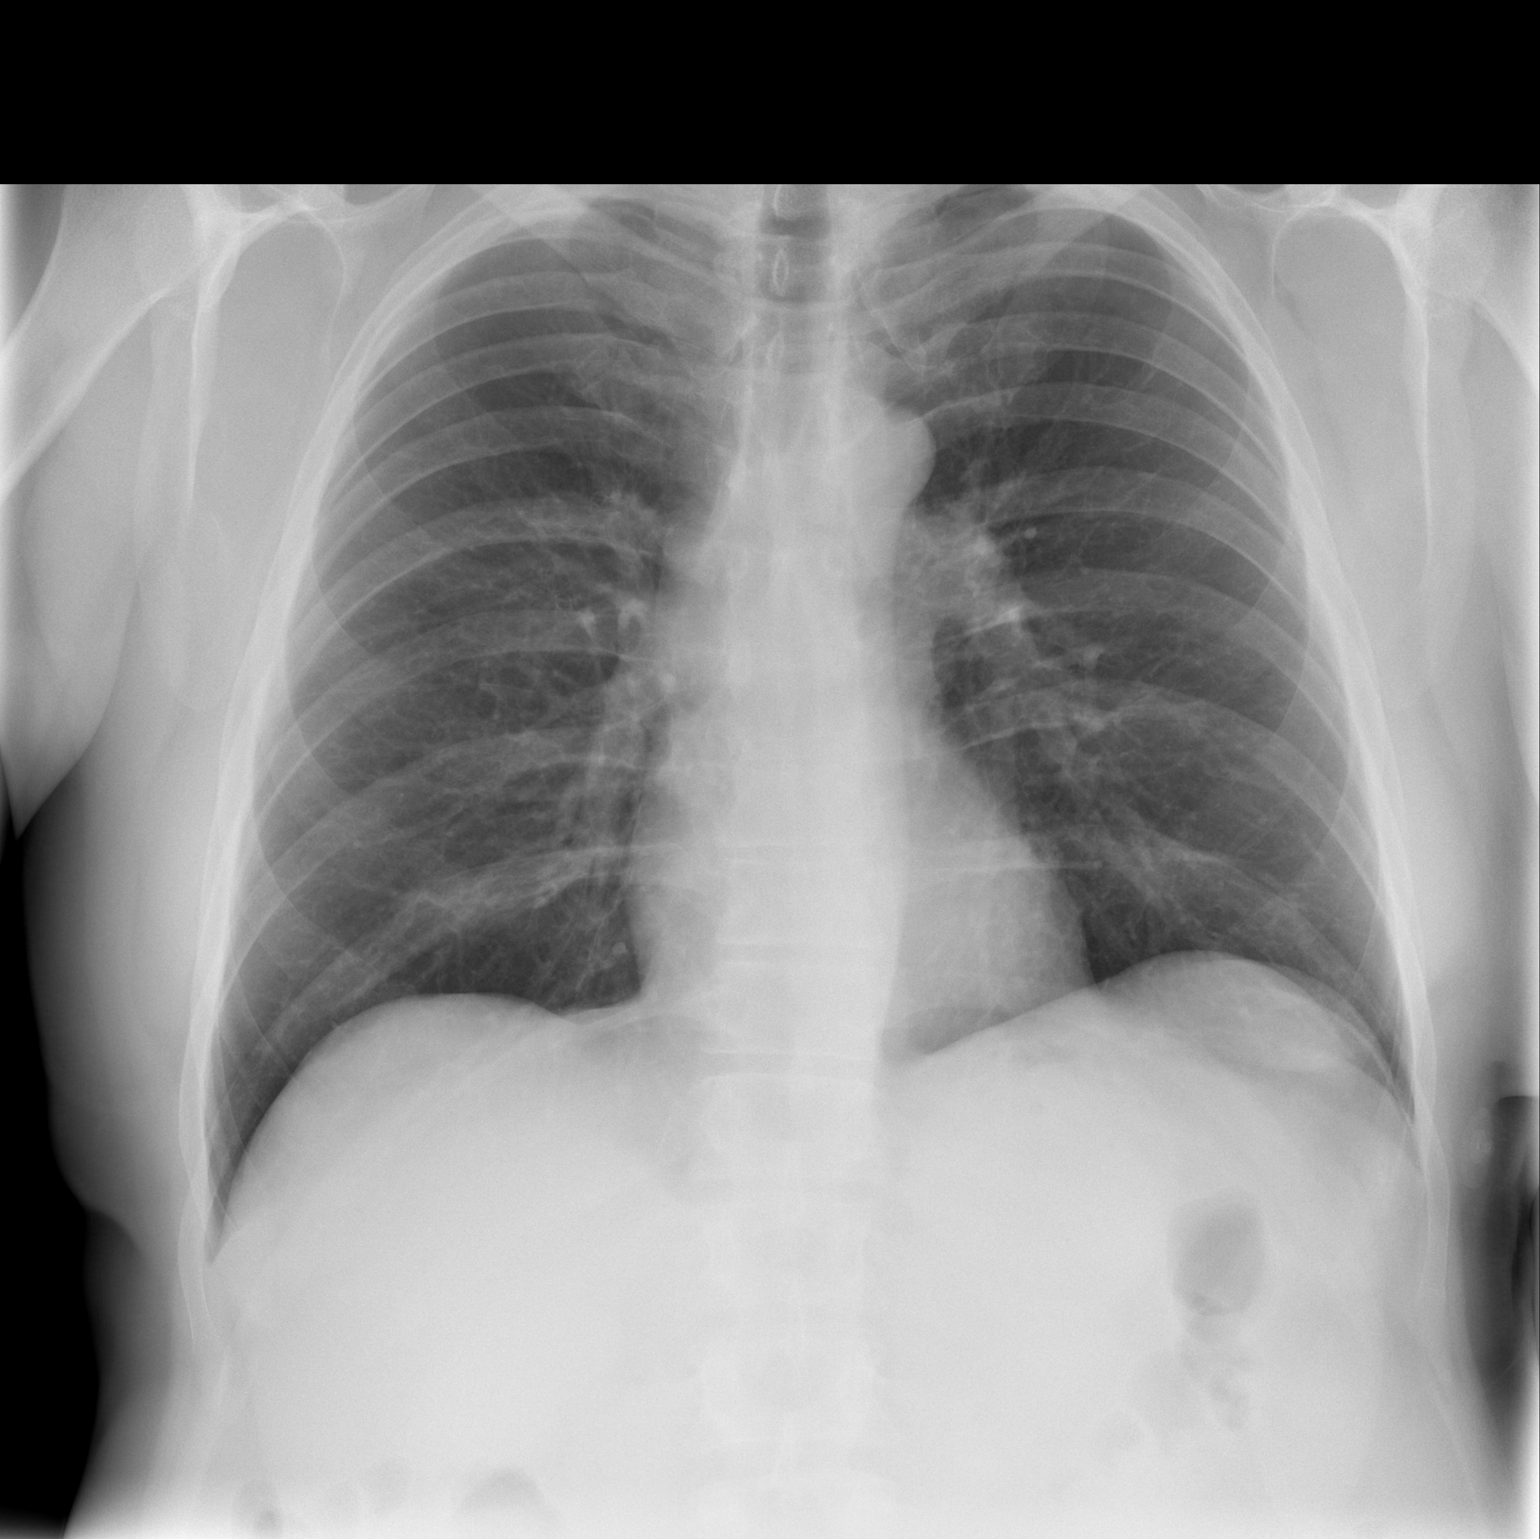

[w chest lat]
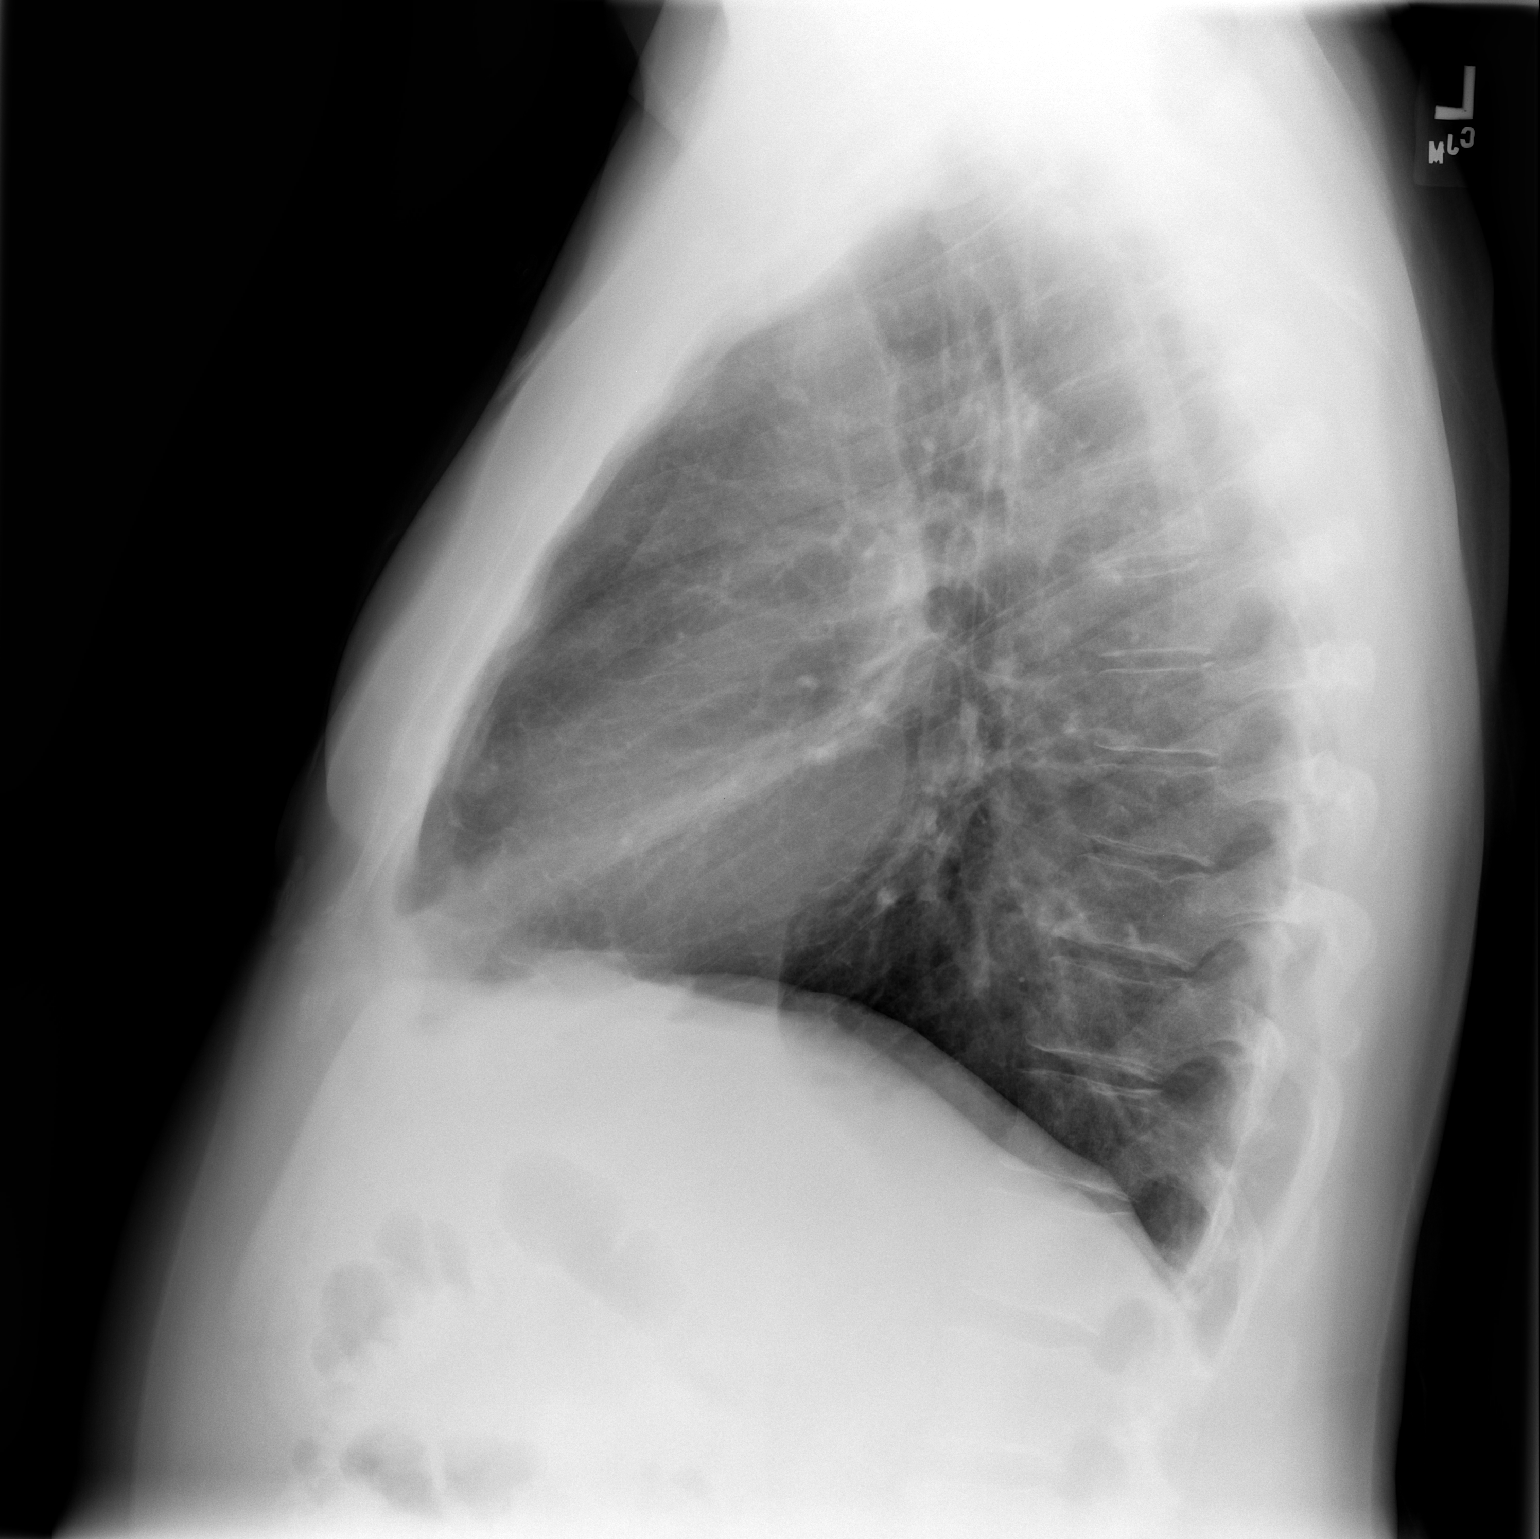

[2 of 2 positions shown; findings below may reference images not displayed]

FINDINGS: The cardiac silhouette, mediastinal and hilar contours
are within normal limits.  The lungs are clear.  The bony thorax is
intact.
IMPRESSION: No acute cardiopulmonary findings.

## 2012-01-20 ENCOUNTER — Other Ambulatory Visit: Payer: Self-pay | Admitting: *Deleted

## 2012-01-20 MED ORDER — EZETIMIBE-SIMVASTATIN 10-80 MG PO TABS: 1.0000 | ORAL_TABLET | Freq: Every day | ORAL | Status: DC

## 2012-05-03 ENCOUNTER — Other Ambulatory Visit: Payer: Self-pay | Admitting: *Deleted

## 2012-05-03 MED ORDER — EZETIMIBE-SIMVASTATIN 10-80 MG PO TABS: 1.0000 | ORAL_TABLET | Freq: Every day | ORAL | Status: DC

## 2012-05-15 ENCOUNTER — Ambulatory Visit (INDEPENDENT_AMBULATORY_CARE_PROVIDER_SITE_OTHER): Payer: BC Managed Care – PPO | Admitting: Family Medicine

## 2012-05-15 ENCOUNTER — Encounter: Payer: Self-pay | Admitting: Family Medicine

## 2012-05-15 VITALS — BP 114/78 | HR 80 | Temp 97.7°F | Ht 70.0 in | Wt 214.2 lb

## 2012-05-15 DIAGNOSIS — Z125 Encounter for screening for malignant neoplasm of prostate: Secondary | ICD-10-CM

## 2012-05-15 DIAGNOSIS — L919 Hypertrophic disorder of the skin, unspecified: Secondary | ICD-10-CM

## 2012-05-15 DIAGNOSIS — Z0000 Encounter for general adult medical examination without abnormal findings: Secondary | ICD-10-CM

## 2012-05-15 DIAGNOSIS — L918 Other hypertrophic disorders of the skin: Secondary | ICD-10-CM | POA: Insufficient documentation

## 2012-05-15 DIAGNOSIS — Z0001 Encounter for general adult medical examination with abnormal findings: Secondary | ICD-10-CM | POA: Insufficient documentation

## 2012-05-15 DIAGNOSIS — Z23 Encounter for immunization: Secondary | ICD-10-CM

## 2012-05-15 DIAGNOSIS — L909 Atrophic disorder of skin, unspecified: Secondary | ICD-10-CM

## 2012-05-15 DIAGNOSIS — E7800 Pure hypercholesterolemia, unspecified: Secondary | ICD-10-CM

## 2012-05-15 DIAGNOSIS — K2270 Barrett's esophagus without dysplasia: Secondary | ICD-10-CM

## 2012-05-15 DIAGNOSIS — B351 Tinea unguium: Secondary | ICD-10-CM

## 2012-05-15 DIAGNOSIS — K219 Gastro-esophageal reflux disease without esophagitis: Secondary | ICD-10-CM

## 2012-05-15 DIAGNOSIS — Z1211 Encounter for screening for malignant neoplasm of colon: Secondary | ICD-10-CM

## 2012-05-15 LAB — COMPREHENSIVE METABOLIC PANEL
AST: 23 U/L (ref 0–37)
Albumin: 4.3 g/dL (ref 3.5–5.2)
BUN: 14 mg/dL (ref 6–23)
Calcium: 9.5 mg/dL (ref 8.4–10.5)
Chloride: 104 mEq/L (ref 96–112)
Potassium: 4.8 mEq/L (ref 3.5–5.1)

## 2012-05-15 LAB — POC HEMOCCULT BLD/STL (OFFICE/1-CARD/DIAGNOSTIC): Fecal Occult Blood, POC: NEGATIVE

## 2012-05-15 LAB — LDL CHOLESTEROL, DIRECT: Direct LDL: 152.5 mg/dL

## 2012-05-15 LAB — LIPID PANEL: VLDL: 17.8 mg/dL (ref 0.0–40.0)

## 2012-05-15 NOTE — Assessment & Plan Note (Signed)
Chronic.  Off vytorin for last several weeks, will check FLP today and discuss lowering dose although if not controlled will recommend continued current dose.

## 2012-05-15 NOTE — Progress Notes (Signed)
Subjective:    Patient ID: Thomas Church, male    DOB: 29-May-1956, 56 y.o.   MRN: [id number]  HPI CC: CPE  Here for annual exam.  HLD - started on pravachol, but this didn't control chol levels and caused muscle cramps.  Then tried crestor and then lipitor which caused myalgias.  Has been on vytorin since - improved control.  Ran out 3 wks ago and just recently refilled.  Wonders about lower dose as has discount coupon for this. Lab Results  Component Value Date   CHOL 239* 03/25/2011   HDL 55.60 03/25/2011   LDLCALC 75 08/19/2009   LDLDIRECT 192.5 03/25/2011   TRIG 80.0 03/25/2011   CHOLHDL 4 03/25/2011    ?h/o barrett's esophagus - by EGD 1998, has not repeated since.  No GERD sxs since started on omeprazole.  R foot issue - thinks has fungal infection of little toe nail as well as possible skin infection as area stays peeled.  Has used tenactin and lotrimin for last several months.  Recently using chlorox.  Also would like skin tags checked/removed.  Will return for this.  Preventative:  Last CPE > 1 yr ago.  Fasting today for blood work. Colon screening - would like to continue iFOB Prostate screening - discussed.  would like yearly check. Td 2003, due this year.  Caffeine: 2-3 cups coffee/day, some sodas/tea Married//Remarried - 2 children - one by first wife// 1 son  Activity: no regular exercise Diet: increasing water, fruits/vegetables daily  Medications and allergies reviewed and updated in chart.  Past histories reviewed and updated if relevant as below. Patient Active Problem List  Diagnosis  . HYPERCHOLESTEROLEMIA  . ERECTILE DYSFUNCTION  . ACID REFLUX DISEASE  . BARRETTS ESOPHAGUS  . CHEST PAIN  . MUSCLE CRAMPS  . WEIGHT GAIN  . Viral URI   Past Medical History  Diagnosis Date  . Chest pain     Exercise echo in 2002 was normal per patient's report. ETT-myoview (2/11):8'35", no chest pain, no ischemic ECG changes, perfusion images showed no ischemia or  infarction, per pt bad muscle cramp  . Hypercholesteremia   . Esophageal reflux     controlled on OTC omeprazole  . erectile dys   . Barrett's esophagus     ?   Past Surgical History  Procedure Date  . Hand surgery     Right, due to olaceration thenar eminence  . Esophagogastroduodenoscopy 01/08/1997    stricture stretched; GERD  . Exercise echo 03/09/2001    normal  . Femoral hernia repair 01/06/2005    left incarcerated (Dr. Daphine Deutscher)   History  Substance Use Topics  . Smoking status: Former Smoker -- 1.0 packs/day for 10 years    Quit date: 03/20/2001  . Smokeless tobacco: Former Neurosurgeon   Comment: quit over 10 years ago  . Alcohol Use: No   Family History  Problem Relation Age of Onset  . Arthritis Mother     Giant cell   . Heart disease Father 86    [place]; CABG x 2, AAA varices  . HIV Father     2nd to 1st CABG  . Hypertension Father   . Cancer Daughter     leukemia  . Stroke Other     remote  . Diabetes Paternal Uncle    No Known Allergies Current Outpatient Prescriptions on File Prior to Visit  Medication Sig Dispense Refill  . ezetimibe-simvastatin (VYTORIN) 10-80 MG per tablet Take 1 tablet by mouth at bedtime.  30 tablet  0  . omeprazole (PRILOSEC OTC) 20 MG tablet Take 20 mg by mouth daily.           Review of Systems  Constitutional: Negative for fever, chills, activity change, appetite change, fatigue and unexpected weight change.  HENT: Negative for hearing loss and neck pain.   Eyes: Negative for visual disturbance.  Respiratory: Negative for cough, chest tightness, shortness of breath and wheezing.   Cardiovascular: Negative for chest pain, palpitations and leg swelling.  Gastrointestinal: Negative for nausea, vomiting, abdominal pain, diarrhea, constipation, blood in stool and abdominal distention.  Genitourinary: Negative for hematuria and difficulty urinating.  Musculoskeletal: Negative for myalgias and arthralgias.  Skin: Negative for rash.    Neurological: Negative for dizziness, seizures, syncope and headaches.  Hematological: Does not bruise/bleed easily.  Psychiatric/Behavioral: Negative for dysphoric mood. The patient is not nervous/anxious.        Objective:   Physical Exam  Nursing note and vitals reviewed. Constitutional: He is oriented to person, place, and time. He appears well-developed and well-nourished. No distress.  HENT:  Head: Normocephalic and atraumatic.  Right Ear: External ear normal.  Left Ear: External ear normal.  Nose: Nose normal.  Mouth/Throat: Oropharynx is clear and moist. No oropharyngeal exudate.  Eyes: Conjunctivae and EOM are normal. Pupils are equal, round, and reactive to light. No scleral icterus.  Neck: Normal range of motion. Neck supple. No thyromegaly present.  Cardiovascular: Normal rate, regular rhythm, normal heart sounds and intact distal pulses.   No murmur heard. Pulses:      Radial pulses are 2+ on the right side, and 2+ on the left side.  Pulmonary/Chest: Effort normal and breath sounds normal. No respiratory distress. He has no wheezes. He has no rales.  Abdominal: Soft. Bowel sounds are normal. He exhibits no distension and no mass. There is no tenderness. There is no rebound and no guarding.  Genitourinary: Rectum normal and prostate normal. Rectal exam shows no external hemorrhoid, no internal hemorrhoid, no fissure, no mass, no tenderness and anal tone normal. Guaiac negative stool. Prostate is not enlarged and not tender.  Musculoskeletal: Normal range of motion. He exhibits no edema.       No foot deformity. Right 4th and 5th toes with thickened, yellow onychomycotic nails Right 4th/5th toes with peeling skin, but no other rash, erythema. No interdigital maceration  Lymphadenopathy:    He has no cervical adenopathy.  Neurological: He is alert and oriented to person, place, and time.       CN grossly intact, station and gait intact  Skin: Skin is warm and dry. No  rash noted.  Psychiatric: He has a normal mood and affect. His behavior is normal. Judgment and thought content normal.      Assessment & Plan:

## 2012-05-15 NOTE — Assessment & Plan Note (Addendum)
Preventative protocols reviewed and updated unless pt declined. Tdap today. Discussed healthy diet/lifestyle. Blood work today as fasting. DRE reassuring. Check PSA today.

## 2012-05-15 NOTE — Patient Instructions (Addendum)
Return at your convenience for skin tag removal. Good to see you today, call us with questions. Stool kit today. We will check blood work today.

## 2012-05-15 NOTE — Assessment & Plan Note (Signed)
Return for removal.  

## 2012-05-15 NOTE — Assessment & Plan Note (Signed)
Controlled on chronic low dose OTC omeprazole.

## 2012-05-15 NOTE — Assessment & Plan Note (Signed)
will start with LFTs and then consider oral antifungal - but will need to monitor closely as on statin and ezetimibe.

## 2012-05-15 NOTE — Assessment & Plan Note (Signed)
?  dx.  If any sxs, will refer to GI for EGD.

## 2012-06-09 ENCOUNTER — Other Ambulatory Visit: Payer: Self-pay | Admitting: Family Medicine

## 2012-10-10 ENCOUNTER — Telehealth: Payer: Self-pay | Admitting: *Deleted

## 2012-10-10 DIAGNOSIS — E7800 Pure hypercholesterolemia, unspecified: Secondary | ICD-10-CM

## 2012-10-10 NOTE — Telephone Encounter (Signed)
Patient asked if he could come in for repeat lipid panel only without an office visit. He said at his last check he had been out of meds x 3 weeks. He would like to see if he could possibly change to a lower dose of medication in order to use a coupon at the pharmacy. Right now he is having to pay full price out of pocket for his medication and will have to pay over $100 to come in for an OV.

## 2012-10-10 NOTE — Telephone Encounter (Signed)
Ok to do.  Placed order in chart. Please check to see that he is taking vytorin 10/80 daily, and see what dose is his coupon for.

## 2012-10-11 NOTE — Telephone Encounter (Signed)
Patient is taking 10/80 daily and coupon is for 10/40. Lab appt scheduled.

## 2012-10-17 ENCOUNTER — Other Ambulatory Visit (INDEPENDENT_AMBULATORY_CARE_PROVIDER_SITE_OTHER): Payer: BC Managed Care – PPO

## 2012-10-17 ENCOUNTER — Ambulatory Visit (INDEPENDENT_AMBULATORY_CARE_PROVIDER_SITE_OTHER): Payer: BC Managed Care – PPO

## 2012-10-17 DIAGNOSIS — E7800 Pure hypercholesterolemia, unspecified: Secondary | ICD-10-CM

## 2012-10-17 DIAGNOSIS — Z23 Encounter for immunization: Secondary | ICD-10-CM

## 2012-10-17 LAB — LIPID PANEL
Cholesterol: 142 mg/dL (ref 0–200)
HDL: 49.8 mg/dL (ref >39.00)

## 2012-10-18 ENCOUNTER — Other Ambulatory Visit: Payer: Self-pay | Admitting: Family Medicine

## 2012-10-18 DIAGNOSIS — Z8249 Family history of ischemic heart disease and other diseases of the circulatory system: Secondary | ICD-10-CM | POA: Insufficient documentation

## 2012-10-18 DIAGNOSIS — E7800 Pure hypercholesterolemia, unspecified: Secondary | ICD-10-CM

## 2012-10-18 MED ORDER — EZETIMIBE-SIMVASTATIN 10-40 MG PO TABS: 1.0000 | ORAL_TABLET | Freq: Every day | ORAL | Status: DC

## 2013-05-21 ENCOUNTER — Ambulatory Visit (INDEPENDENT_AMBULATORY_CARE_PROVIDER_SITE_OTHER): Payer: BC Managed Care – PPO | Admitting: Family Medicine

## 2013-05-21 ENCOUNTER — Encounter: Payer: Self-pay | Admitting: Family Medicine

## 2013-05-21 VITALS — BP 136/82 | HR 80 | Temp 97.8°F | Ht 70.0 in | Wt 209.2 lb

## 2013-05-21 DIAGNOSIS — R21 Rash and other nonspecific skin eruption: Secondary | ICD-10-CM

## 2013-05-21 DIAGNOSIS — Z0000 Encounter for general adult medical examination without abnormal findings: Secondary | ICD-10-CM

## 2013-05-21 DIAGNOSIS — E7800 Pure hypercholesterolemia, unspecified: Secondary | ICD-10-CM

## 2013-05-21 DIAGNOSIS — Z1211 Encounter for screening for malignant neoplasm of colon: Secondary | ICD-10-CM

## 2013-05-21 DIAGNOSIS — Z125 Encounter for screening for malignant neoplasm of prostate: Secondary | ICD-10-CM

## 2013-05-21 DIAGNOSIS — K219 Gastro-esophageal reflux disease without esophagitis: Secondary | ICD-10-CM

## 2013-05-21 LAB — COMPREHENSIVE METABOLIC PANEL
ALT: 48 U/L (ref 0–53)
AST: 36 U/L (ref 0–37)
Alkaline Phosphatase: 57 U/L (ref 39–117)
BUN: 17 mg/dL (ref 6–23)
Creatinine, Ser: 1.1 mg/dL (ref 0.4–1.5)
Potassium: 4.3 mEq/L (ref 3.5–5.1)

## 2013-05-21 LAB — LIPID PANEL
Cholesterol: 158 mg/dL (ref 0–200)
LDL Cholesterol: 104 mg/dL — ABNORMAL HIGH (ref 0–99)
Total CHOL/HDL Ratio: 4
Triglycerides: 57 mg/dL (ref 0.0–149.0)
VLDL: 11.4 mg/dL (ref 0.0–40.0)

## 2013-05-21 NOTE — Assessment & Plan Note (Signed)
Anticipate tinea cruris - recommended OTC lotrimin. Update if not improved with this.

## 2013-05-21 NOTE — Progress Notes (Signed)
Subjective:    Patient ID: Thomas Church, male    DOB: 05/03/1956, 57 y.o.   MRN: [id number]  HPI CC: CPE  ?h/o barrett's, as well as GERD - takes omeprazole 20mg  daily, sometimes 40mg  daily.  This controls sxs well.  No red flag sxs (dysphagia, vomiting, abd pain, chest pain).  No breakthrough sxs on PPI.  Chronic PPI therapy.    Preventative:  Last CPE > 1 yr ago. Fasting today for blood work.  Colon screening - would like to continue iFOB  Prostate screening - discussed. would like yearly check.  Tdap 2013  Caffeine: 2 cups coffee/day, some sodas/tea  Married//Remarried - 2 children - one by first wife/ 1 son  Occupation: Glass blower/designer Activity: no regular exercise  Diet: increasing water, fruits/vegetables daily  Medications and allergies reviewed and updated in chart.  Past histories reviewed and updated if relevant as below. Patient Active Problem List   Diagnosis Date Noted  . Family history of early CAD 10/18/2012  . Healthcare maintenance 05/15/2012  . Cutaneous skin tags 05/15/2012  . Onychomycosis 05/15/2012  . MUSCLE CRAMPS 12/10/2010  . WEIGHT GAIN 12/10/2010  . ACID REFLUX DISEASE 12/04/2009  . CHEST PAIN 12/04/2009  . HYPERCHOLESTEROLEMIA 11/24/2007  . ERECTILE DYSFUNCTION 11/24/2007  . BARRETTS ESOPHAGUS 11/24/2007   Past Medical History  Diagnosis Date  . Chest pain     Exercise echo in 2002 was normal per patient's report. ETT-myoview (2/11):8'35", no chest pain, no ischemic ECG changes, perfusion images showed no ischemia or infarction, per pt bad muscle cramp  . Hypercholesteremia   . Esophageal reflux     controlled on OTC omeprazole  . erectile dys   . Barrett's esophagus     ?   Past Surgical History  Procedure Laterality Date  . Hand surgery      Right, due to olaceration thenar eminence  . Esophagogastroduodenoscopy  01/08/1997    stricture stretched; GERD  . Exercise echo  03/09/2001    normal  . Femoral hernia repair  01/06/2005   left incarcerated (Dr. Daphine Deutscher)   History  Substance Use Topics  . Smoking status: Former Smoker -- 1.00 packs/day for 10 years    Quit date: 03/20/2001  . Smokeless tobacco: Former Neurosurgeon     Comment: quit over 10 years ago  . Alcohol Use: No   Family History  Problem Relation Age of Onset  . Arthritis Mother     Giant cell   . Heart disease Father 1    [place]; CABG x 2, AAA varices  . HIV Father     2nd to 1st CABG  . Hypertension Father   . Cancer Daughter     leukemia  . Stroke Other     remote  . Diabetes Paternal Uncle    No Known Allergies Current Outpatient Prescriptions on File Prior to Visit  Medication Sig Dispense Refill  . ezetimibe-simvastatin (VYTORIN) 10-40 MG per tablet Take 1 tablet by mouth at bedtime.  30 tablet  11  . omeprazole (PRILOSEC OTC) 20 MG tablet Take 20 mg by mouth daily.         No current facility-administered medications on file prior to visit.    Review of Systems  Constitutional: Negative for fever, chills, activity change, appetite change, fatigue and unexpected weight change.  HENT: Negative for hearing loss and neck pain.   Eyes: Negative for visual disturbance.  Respiratory: Negative for cough, chest tightness, shortness of breath and wheezing.   Cardiovascular:  Negative for chest pain, palpitations and leg swelling.  Gastrointestinal: Negative for nausea, vomiting, abdominal pain, diarrhea, constipation, blood in stool and abdominal distention.  Genitourinary: Negative for hematuria and difficulty urinating.  Musculoskeletal: Negative for myalgias and arthralgias.  Skin: Negative for rash.  Neurological: Negative for dizziness, seizures, syncope and headaches.  Hematological: Negative for adenopathy. Does not bruise/bleed easily.  Psychiatric/Behavioral: Negative for dysphoric mood. The patient is not nervous/anxious.        Objective:   Physical Exam  Nursing note and vitals reviewed. Constitutional: He is oriented to person,  place, and time. He appears well-developed and well-nourished. No distress.  HENT:  Head: Normocephalic and atraumatic.  Right Ear: External ear normal.  Left Ear: External ear normal.  Nose: Nose normal.  Mouth/Throat: Oropharynx is clear and moist. No oropharyngeal exudate.  Eyes: Conjunctivae and EOM are normal. Pupils are equal, round, and reactive to light. No scleral icterus.  Neck: Normal range of motion. Neck supple. No thyromegaly present.  Cardiovascular: Normal rate, regular rhythm, normal heart sounds and intact distal pulses.   No murmur heard. Pulses:      Radial pulses are 2+ on the right side, and 2+ on the left side.  Pulmonary/Chest: Effort normal and breath sounds normal. No respiratory distress. He has no wheezes. He has no rales.  Abdominal: Soft. Bowel sounds are normal. He exhibits no distension and no mass. There is no tenderness. There is no rebound and no guarding.  Umbilical hernia present, nontender, reducible.  About 3cm defect  Genitourinary: Rectum normal and prostate normal. Rectal exam shows no external hemorrhoid, no internal hemorrhoid, no fissure, no mass, no tenderness and anal tone normal. Prostate is not enlarged.  Scaly erythematous annular rash in buttock crease above and below anus.  Musculoskeletal: Normal range of motion. He exhibits no edema.  Lymphadenopathy:    He has no cervical adenopathy.  Neurological: He is alert and oriented to person, place, and time.  CN grossly intact, station and gait intact  Skin: Skin is warm and dry. No rash noted.  Psychiatric: He has a normal mood and affect. His behavior is normal. Judgment and thought content normal.       Assessment & Plan:

## 2013-05-21 NOTE — Assessment & Plan Note (Signed)
Continue PPI daily. 

## 2013-05-21 NOTE — Assessment & Plan Note (Signed)
-  continue meds  

## 2013-05-21 NOTE — Patient Instructions (Addendum)
Blood work today to check prostate and cholesterol. Stool kit today - pass by lab to pick this up. Good to see you today Return in 1 year for next physical or as needed May try lotrimin cream for rash - let me know if not improved. Look into [email or web address] for coupons

## 2013-05-21 NOTE — Assessment & Plan Note (Signed)
Preventative protocols reviewed and updated unless pt declined. Discussed healthy diet and lifestyle.  DRE today reassuring. Check PSA today. iFOB today

## 2013-05-22 ENCOUNTER — Encounter: Payer: Self-pay | Admitting: *Deleted

## 2013-05-22 ENCOUNTER — Encounter: Payer: Self-pay | Admitting: Family Medicine

## 2013-06-14 ENCOUNTER — Other Ambulatory Visit: Payer: BC Managed Care – PPO

## 2013-06-14 DIAGNOSIS — Z1211 Encounter for screening for malignant neoplasm of colon: Secondary | ICD-10-CM

## 2013-06-14 LAB — FECAL OCCULT BLOOD, IMMUNOCHEMICAL: Fecal Occult Bld: NEGATIVE

## 2013-06-15 ENCOUNTER — Encounter: Payer: Self-pay | Admitting: Family Medicine

## 2013-06-15 ENCOUNTER — Encounter: Payer: Self-pay | Admitting: *Deleted

## 2013-06-15 LAB — FECAL OCCULT BLOOD, GUAIAC: Fecal Occult Blood: NEGATIVE

## 2013-10-11 ENCOUNTER — Ambulatory Visit (INDEPENDENT_AMBULATORY_CARE_PROVIDER_SITE_OTHER): Payer: BC Managed Care – PPO | Admitting: Family Medicine

## 2013-10-11 ENCOUNTER — Encounter: Payer: Self-pay | Admitting: Family Medicine

## 2013-10-11 VITALS — BP 126/76 | HR 83 | Temp 98.2°F | Ht 70.0 in | Wt 216.5 lb

## 2013-10-11 DIAGNOSIS — K121 Other forms of stomatitis: Secondary | ICD-10-CM

## 2013-10-11 DIAGNOSIS — R6884 Jaw pain: Secondary | ICD-10-CM

## 2013-10-11 DIAGNOSIS — K1370 Unspecified lesions of oral mucosa: Secondary | ICD-10-CM

## 2013-10-11 MED ORDER — AMOXICILLIN-POT CLAVULANATE 875-125 MG PO TABS: 1.0000 | ORAL_TABLET | Freq: Two times a day (BID) | ORAL | Status: DC

## 2013-10-11 NOTE — Progress Notes (Signed)
Pre-visit discussion using our clinic review tool. No additional management support is needed unless otherwise documented below in the visit note.  

## 2013-10-11 NOTE — Progress Notes (Signed)
   Date:  10/11/2013   Name:  Thomas Church   DOB:  21-Feb-1956   MRN:  [id number] Gender: male Age: 57 y.o.  Primary Physician:  Eustaquio Boyden, MD   Chief Complaint: Jaw Pain and White Spots on throat   Subjective:   History of Present Illness:  Thomas Church is a 57 y.o. very pleasant male patient who presents with the following:  For the last week or so, the patient developed painful ulcers in his mouth, largest on the roof of his mouth and palate and another behind teeth on the right. They do hurt. Not had before. He does get cold sores, but nothing like this in the past. Also with pain in the neck, side of face, and in the ear.   Past Medical History, Surgical History, Social History, Family History, Problem List, Medications, and Allergies have been reviewed and updated if relevant.  Review of Systems: ROS: GEN: Acute illness details above GI: Tolerating PO intake GU: maintaining adequate hydration and urination Pulm: No SOB Interactive and getting along well at home.  Otherwise, ROS is as per the HPI.   Objective:   Physical Examination: BP 126/76  Pulse 83  Temp(Src) 98.2 F (36.8 C) (Oral)  Ht 5\' 10"  (1.778 m)  Wt 216 lb 8 oz (98.204 kg)  BMI 31.06 kg/m2   Gen: WDWN, NAD; A & O x3, cooperative. Pleasant.Globally Non-toxic HEENT: Normocephalic and atraumatic. approx 6 mm white ulcer on palate with surrounding redness, smaller one corner of R mouth, R TM clear, L TM - good landmarks, No fluid present. No rhinnorhea.  MMM Frontal sinuses: NT Max sinuses: NT NECK: Anterior cervical  LAD is moderately enlarged, more on the right CV: RRR, No M/G/R, cap refill <2 sec PULM: Breathing comfortably in no respiratory distress. no wheezing, crackles, rhonchi EXT: No c/c/e PSYCH: Friendly, good eye contact MSK: Nml gait     Laboratory and Imaging Data:  Assessment & Plan:    Jaw pain  Mouth ulcers  Not with appearance of herpes. Unclear etiology. Not typical  of apthous ulcers. Given level of pain throughout R side of face, jaw, ear, and sinus, I am going to treat with augmentin course, which should cover almost any form of bacterial process in this area. No abscess appreciated.  New medications, updates to list, dose adjustments: Meds ordered this encounter  Medications  . amoxicillin-clavulanate (AUGMENTIN) 875-125 MG per tablet    Sig: Take 1 tablet by mouth 2 (two) times daily.    Dispense:  20 tablet    Refill:  0    Signed,  Earlena Werst T. Curry Dulski, MD, CAQ Sports Medicine  [facility] at [facility] [address] [place] [address] Phone: [phone] Fax: [phone]  Updated Complete Medication List:   Medication List       This list is accurate as of: 10/11/13 11:59 PM.  Always use your most recent med list.               amoxicillin-clavulanate 875-125 MG per tablet  Commonly known as:  AUGMENTIN  Take 1 tablet by mouth 2 (two) times daily.     ezetimibe-simvastatin 10-40 MG per tablet  Commonly known as:  VYTORIN  Take 1 tablet by mouth at bedtime.     PRILOSEC OTC 20 MG tablet  Generic drug:  omeprazole  Take 20 mg by mouth daily.

## 2013-11-06 ENCOUNTER — Other Ambulatory Visit: Payer: Self-pay | Admitting: Family Medicine

## 2014-01-07 ENCOUNTER — Encounter: Payer: Self-pay | Admitting: *Deleted

## 2014-01-07 ENCOUNTER — Telehealth: Payer: Self-pay | Admitting: *Deleted

## 2014-01-07 MED ORDER — SILDENAFIL CITRATE 100 MG PO TABS: 50.0000 mg | ORAL_TABLET | Freq: Every day | ORAL | Status: DC | PRN

## 2014-01-07 NOTE — Telephone Encounter (Signed)
Spoke with patient. He was asking if he could get a refill on Viagra. He hasn't had it in a very long time and wasn't sure if you would refill it without an appt or not.

## 2014-01-07 NOTE — Telephone Encounter (Signed)
Patient notified

## 2014-01-07 NOTE — Telephone Encounter (Signed)
plz notify viagra was sent in.

## 2014-05-21 ENCOUNTER — Ambulatory Visit (INDEPENDENT_AMBULATORY_CARE_PROVIDER_SITE_OTHER): Payer: BC Managed Care – PPO | Admitting: Podiatry

## 2014-05-21 ENCOUNTER — Encounter: Payer: Self-pay | Admitting: Podiatry

## 2014-05-21 ENCOUNTER — Telehealth: Payer: Self-pay | Admitting: *Deleted

## 2014-05-21 VITALS — BP 117/71 | HR 87 | Resp 16 | Ht 70.0 in | Wt 205.0 lb

## 2014-05-21 DIAGNOSIS — L608 Other nail disorders: Secondary | ICD-10-CM

## 2014-05-21 NOTE — Telephone Encounter (Signed)
Right 1, 4, [facility]th [facility] fungal element.

## 2014-05-21 NOTE — Progress Notes (Signed)
   Subjective:    Patient ID: Thomas FantasiaMark J Church, male    DOB: 1956-05-14, 58 y.o.   MRN: [id number]  HPI Comments: "I would like to get rid of this toe fungus"  Patient C/O thick, discolored toenails, 1st, 4th, and 5th nails right for several years. He also has some peeling skin between toes and plantar feet that burns. He has tried OTC meds, creams, sprays, and Clorox - no help.     Review of Systems  Musculoskeletal: Positive for arthralgias.  Skin:       Change  In nails  All other systems reviewed and are negative.      Objective:   Physical Exam: I have reviewed his past medical history medications allergies surgeries social history and review of systems. Pulses are strongly palpable bilateral. Neurologic sensorium is intact per since once the monofilament. Deep tendon reflexes are intact bilateral muscle strength is 5 over 5 dorsiflexors plantar flexors inverters everters all intrinsic musculature is intact. Orthopedic evaluation demonstrates all cutaneous evaluation demonstrates supple well hydrated cutis with exception of tinea pedis plantar aspect the bilateral foot and interdigitally. He also has nails 14 and 5 of the right foot didn't do appear to be thick and and possibly dystrophic but we cannot rule out onychomycosis this time.  Assessment: Nail dystrophy can't rule out onychomycosis to toes #145 of the right foot. And tinea pedis.  Plan: Samples of the nail and skin were taken today sent for pathologic evaluation will followup with her once those come in.        Assessment & Plan:

## 2014-05-21 NOTE — Patient Instructions (Signed)
Onychomycosis/Fungal Toenails  WHAT IS IT? An infection that lies within the keratin of your nail plate that is caused by a fungus.  WHY ME? Fungal infections affect all ages, sexes, races, and creeds.  There may be many factors that predispose you to a fungal infection such as age, coexisting medical conditions such as diabetes, or an autoimmune disease; stress, medications, fatigue, genetics, etc.  Bottom line: fungus thrives in a warm, moist environment and your shoes offer such a location.  IS IT CONTAGIOUS? Theoretically, yes.  You do not want to share shoes, nail clippers or files with someone who has fungal toenails.  Walking around barefoot in the same room or sleeping in the same bed is unlikely to transfer the organism.  It is important to realize, however, that fungus can spread easily from one nail to the next on the same foot.  HOW DO WE TREAT THIS?  There are several ways to treat this condition.  Treatment may depend on many factors such as age, medications, pregnancy, liver and kidney conditions, etc.  It is best to ask your doctor which options are available to you.  1. No treatment.   Unlike many other medical concerns, you can live with this condition.  However for many people this can be a painful condition and may lead to ingrown toenails or a bacterial infection.  It is recommended that you keep the nails cut short to help reduce the amount of fungal nail. 2. Topical treatment.  These range from herbal remedies to prescription strength nail lacquers.  About 40-50% effective, topicals require twice daily application for approximately 9 to 12 months or until an entirely new nail has grown out.  The most effective topicals are medical grade medications available through physicians offices. 3. Oral antifungal medications.  With an 80-90% cure rate, the most common oral medication requires 3 to 4 months of therapy and stays in your system for a year as the new nail grows out.  Oral  antifungal medications do require blood work to make sure it is a safe drug for you.  A liver function panel will be performed prior to starting the medication and after the first month of treatment.  It is important to have the blood work performed to avoid any harmful side effects.  In general, this medication safe but blood work is required. 4. Laser Therapy.  This treatment is performed by applying a specialized laser to the affected nail plate.  This therapy is noninvasive, fast, and non-painful.  It is not covered by insurance and is therefore, out of pocket.  The results have been very good with a 80-95% cure rate.  [facility] is the only practice in the area to offer this therapy. 5. Permanent Nail Avulsion.  Removing the entire nail so that a new nail will not grow back.   Athlete's Foot Athlete's foot (tinea pedis) is a contagious fungal skin infection of the feet. Athlete's foot usually affects the skin between the fourth and fifth toes. It is called athlete's foot because it is a common occurrence in athletes. SYMPTOMS   Scales on the feet, most commonly between the toes, that appear moist, soft, gray-white, or red.  Dead skin between the toes.  Itching in the inflamed areas.  Damp, musty foot odor.  Sometimes, small blisters on the feet caused by a hypersensitivity to the fungus. CAUSES  Infection is caused by contact with a fungus or yeast. The fungus or yeast typically resides   in moist socks and shoes because the fungus thrives in dark, moist environments. RISK INCREASES WITH:  Walking barefoot in public places such as bathrooms or showers.  Poor hygiene of the feet such as infrequent washing of the feet or infrequent changing of shoes or socks, or both.  Hot, humid weather.  Forgetting to dry the spaces between your toes. PREVENTION  Follow good locker room hygiene.  Use your own towels.  Wear shoes or sandals.  Wash with warm or hot water and  soap.  Wash your feet daily. Dry thoroughly, especially between the toes. Dust with talcum powder or antifungal powder.  Allow feet to dry out by occasionally walking barefoot.  Change socks daily.  Wear socks made of cotton, wool, or other natural absorbent fibers. Avoid socks made from synthetic fibers. PROGNOSIS  With appropriate treatment, athlete's foot typically resolves within 3 weeks although recurrence is common.  RELATED COMPLICATIONS   Chronic infection or recurrence, especially if not appropriately or completely treated.  Bacterial infection on top of the fungal infection in the affected area (bacterial superinfection or secondary bacterial infection).  Rarely, an allergic autoimmune response to the infection on the hands and face. TREATMENT Initially, keep the affected area dry and cool. Remove the scaly and dead material if it is present. Change socks daily. Walking barefoot or wearing sandals whenever possible helps dry the affected area. Use antifungal powders, creams, or ointments after each bath. If the infection does not respond to topical treatment, contact your health care provider and he or she may prescribe oral antifungal medication. MEDICATION   Non-prescription antifungal creams, ointments, or powders can be used on your feet or toes, or in shoes.  Anti-itch medications may be prescribed as necessary by your health care provider. Use only as directed and only as much as you need.  Oral antifungal medications may be prescribed by your health care provider. Take the entire course of medication as prescribed. SEEK MEDICAL CARE IF:   Symptoms get worse or do not improve in 2 weeks despite treatment.  New, unexplained symptoms develop (drugs used in treatment may produce side effects). Document Released: 10/18/2005 Document Revised: 10/23/2013 Document Reviewed: 01/30/2009 [facility] Patient Information 2015 [employer] [place]. This information is not intended to  replace advice given to you by your health care provider. Make sure you discuss any questions you have with your health care provider.

## 2014-06-04 ENCOUNTER — Encounter: Payer: Self-pay | Admitting: Podiatry

## 2014-06-25 ENCOUNTER — Encounter: Payer: Self-pay | Admitting: Podiatry

## 2014-06-25 ENCOUNTER — Ambulatory Visit (INDEPENDENT_AMBULATORY_CARE_PROVIDER_SITE_OTHER): Payer: BC Managed Care – PPO | Admitting: Podiatry

## 2014-06-25 DIAGNOSIS — Z79899 Other long term (current) drug therapy: Secondary | ICD-10-CM

## 2014-06-25 LAB — CBC WITH DIFFERENTIAL/PLATELET
BASOS ABS: 0.1 10*3/uL (ref 0.0–0.1)
Basophils Relative: 1 % (ref 0–1)
Eosinophils Absolute: 0.2 10*3/uL (ref 0.0–0.7)
Eosinophils Relative: 2 % (ref 0–5)
HEMATOCRIT: 43 % (ref 39.0–52.0)
Hemoglobin: 14.6 g/dL (ref 13.0–17.0)
LYMPHS PCT: 37 % (ref 12–46)
Lymphs Abs: 2.8 10*3/uL (ref 0.7–4.0)
MCH: 29.9 pg (ref 26.0–34.0)
MCHC: 34 g/dL (ref 30.0–36.0)
MCV: 87.9 fL (ref 78.0–100.0)
MONO ABS: 0.5 10*3/uL (ref 0.1–1.0)
Monocytes Relative: 7 % (ref 3–12)
Neutro Abs: 4 10*3/uL (ref 1.7–7.7)
Neutrophils Relative %: 53 % (ref 43–77)
Platelets: 230 10*3/uL (ref 150–400)
RBC: 4.89 MIL/uL (ref 4.22–5.81)
RDW: 13.5 % (ref 11.5–15.5)
WBC: 7.6 10*3/uL (ref 4.0–10.5)

## 2014-06-25 LAB — HEPATIC FUNCTION PANEL
ALT: 24 U/L (ref 0–53)
AST: 24 U/L (ref 0–37)
Albumin: 4.7 g/dL (ref 3.5–5.2)
Alkaline Phosphatase: 49 U/L (ref 39–117)
BILIRUBIN DIRECT: 0.2 mg/dL (ref 0.0–0.3)
BILIRUBIN TOTAL: 0.8 mg/dL (ref 0.2–1.2)
Indirect Bilirubin: 0.6 mg/dL (ref 0.2–1.2)
Total Protein: 7.3 g/dL (ref 6.0–8.3)

## 2014-06-25 MED ORDER — TERBINAFINE HCL 250 MG PO TABS: 250.0000 mg | ORAL_TABLET | Freq: Every day | ORAL | Status: DC

## 2014-06-25 NOTE — Progress Notes (Signed)
He presents today for followup of his pathology from his toenail biopsy.  Objective: Vital signs are stable he is alert and oriented x3. Pulses are strongly palpable bilateral. Mycotic nails.  Assessment: Onychomycosis bilateral.  Plan: Send him for liver profile and CBC to be performed today prior to placing him on Lamisil therapy 250 mg. 30 will be dispensed and I will followup with him in one month. I will call with concerns regarding his profile should be necessary.

## 2014-06-26 ENCOUNTER — Telehealth: Payer: Self-pay | Admitting: *Deleted

## 2014-06-26 NOTE — Telephone Encounter (Signed)
Spoke to patient let him know his blood work was fine and continue with medication

## 2014-06-26 NOTE — Telephone Encounter (Signed)
Message copied by Bing Ree on Wed Jun 26, 2014  8:49 AM ------      Message from: Ernestene Kiel T      Created: Tue Jun 25, 2014  4:51 PM       bloodwork looks good continue medication. ------

## 2014-06-27 ENCOUNTER — Telehealth: Payer: Self-pay | Admitting: *Deleted

## 2014-06-27 NOTE — Telephone Encounter (Signed)
I called and informed him that his labs were okay per Dr. Al Corpus.  Okay to start your medication.  He said, okay thanks!

## 2014-07-02 ENCOUNTER — Other Ambulatory Visit: Payer: Self-pay | Admitting: Family Medicine

## 2014-07-23 ENCOUNTER — Ambulatory Visit (INDEPENDENT_AMBULATORY_CARE_PROVIDER_SITE_OTHER): Payer: BC Managed Care – PPO | Admitting: Podiatry

## 2014-07-23 ENCOUNTER — Encounter: Payer: Self-pay | Admitting: Podiatry

## 2014-07-23 VITALS — BP 124/77 | HR 71 | Resp 16

## 2014-07-23 DIAGNOSIS — Z79899 Other long term (current) drug therapy: Secondary | ICD-10-CM

## 2014-07-23 DIAGNOSIS — L608 Other nail disorders: Secondary | ICD-10-CM

## 2014-07-23 LAB — HEPATIC FUNCTION PANEL
ALT: 23 U/L (ref 0–53)
AST: 20 U/L (ref 0–37)
Albumin: 4.6 g/dL (ref 3.5–5.2)
Alkaline Phosphatase: 44 U/L (ref 39–117)
BILIRUBIN DIRECT: 0.1 mg/dL (ref 0.0–0.3)
BILIRUBIN INDIRECT: 0.3 mg/dL (ref 0.2–1.2)
Total Bilirubin: 0.4 mg/dL (ref 0.2–1.2)
Total Protein: 7.4 g/dL (ref 6.0–8.3)

## 2014-07-23 MED ORDER — TERBINAFINE HCL 250 MG PO TABS: 250.0000 mg | ORAL_TABLET | Freq: Every day | ORAL | Status: DC

## 2014-07-23 NOTE — Progress Notes (Signed)
He presents today one month after taking his Lamisil therapy states it is doing quite well with that he denies fever chills nausea vomiting muscle aches and pains. No itching no rashes he states.  Objective: Onychomycosis bilateral.  Assessment: Onychomycosis bilateral.  Plan: Start 90 days of Lamisil 250 mg tablets one by mouth daily discussed pros and cons of this today with him. Wrote another prescription for liver profile and CBC should this come back abnormal we will notify him immediately .

## 2014-07-27 ENCOUNTER — Other Ambulatory Visit: Payer: Self-pay | Admitting: Family Medicine

## 2014-07-31 ENCOUNTER — Other Ambulatory Visit: Payer: Self-pay | Admitting: *Deleted

## 2014-07-31 MED ORDER — SILDENAFIL CITRATE 100 MG PO TABS: 50.0000 mg | ORAL_TABLET | Freq: Every day | ORAL | Status: DC | PRN

## 2014-08-01 ENCOUNTER — Telehealth: Payer: Self-pay | Admitting: *Deleted

## 2014-08-01 NOTE — Telephone Encounter (Signed)
I called and informed the patient that Dr. Al CorpusHyatt reviewed lab results and said everything looks good, okay to continue Lamisil.  He stated, "Okay thank you."

## 2014-08-05 ENCOUNTER — Other Ambulatory Visit: Payer: Self-pay | Admitting: Family Medicine

## 2014-08-05 MED ORDER — SILDENAFIL CITRATE 20 MG PO TABS: 40.0000 mg | ORAL_TABLET | Freq: Every day | ORAL | Status: DC | PRN

## 2014-11-21 ENCOUNTER — Ambulatory Visit (INDEPENDENT_AMBULATORY_CARE_PROVIDER_SITE_OTHER): Payer: BLUE CROSS/BLUE SHIELD | Admitting: Podiatry

## 2014-11-21 ENCOUNTER — Encounter: Payer: Self-pay | Admitting: Podiatry

## 2014-11-21 VITALS — BP 122/86 | HR 90 | Resp 12

## 2014-11-21 DIAGNOSIS — Z79899 Other long term (current) drug therapy: Secondary | ICD-10-CM

## 2014-11-21 MED ORDER — TERBINAFINE HCL 250 MG PO TABS
250.0000 mg | ORAL_TABLET | Freq: Every day | ORAL | Status: DC
Start: 2014-11-21 — End: 2015-02-12

## 2014-11-21 NOTE — Progress Notes (Signed)
He presents today for follow-up of his onychomycosis. He has completed his Lamisil therapy.  Objective: Her signs are stable alert and oriented 3. Pulses are palpable. Nail plates appear to be clearing fourth digit fifth digit of the right foot. The hallux is almost 100% clear.  Assessment: Well healing onychomycosis right foot.  Plan: I'm going to encourage him to take Lamisil 250 mg tablets 1 by mouth every other day and I will follow-up with him in 3 months

## 2014-12-24 ENCOUNTER — Other Ambulatory Visit: Payer: Self-pay | Admitting: Family Medicine

## 2014-12-25 ENCOUNTER — Other Ambulatory Visit: Payer: Self-pay | Admitting: Family Medicine

## 2015-01-08 ENCOUNTER — Other Ambulatory Visit: Payer: Self-pay | Admitting: *Deleted

## 2015-01-08 MED ORDER — EZETIMIBE-SIMVASTATIN 10-40 MG PO TABS: ORAL_TABLET | ORAL | Status: DC

## 2015-01-08 NOTE — Telephone Encounter (Signed)
Pt called to ck on status of [facility]; [facility] and getting rx ready for pick up now. Pt notified and voiced understanding.

## 2015-02-06 ENCOUNTER — Other Ambulatory Visit (INDEPENDENT_AMBULATORY_CARE_PROVIDER_SITE_OTHER): Payer: BLUE CROSS/BLUE SHIELD

## 2015-02-06 ENCOUNTER — Other Ambulatory Visit: Payer: Self-pay | Admitting: Family Medicine

## 2015-02-06 DIAGNOSIS — Z125 Encounter for screening for malignant neoplasm of prostate: Secondary | ICD-10-CM

## 2015-02-06 DIAGNOSIS — E538 Deficiency of other specified B group vitamins: Secondary | ICD-10-CM

## 2015-02-06 DIAGNOSIS — R7989 Other specified abnormal findings of blood chemistry: Secondary | ICD-10-CM

## 2015-02-06 DIAGNOSIS — E7800 Pure hypercholesterolemia, unspecified: Secondary | ICD-10-CM

## 2015-02-06 LAB — LIPID PANEL
Cholesterol: 148 mg/dL (ref 0–200)
HDL: 53.8 mg/dL (ref >39.00)
LDL Cholesterol: 77 mg/dL (ref 0–99)
NONHDL: 94.2
Total CHOL/HDL Ratio: 3
Triglycerides: 88 mg/dL (ref 0.0–149.0)
VLDL: 17.6 mg/dL (ref 0.0–40.0)

## 2015-02-06 LAB — COMPREHENSIVE METABOLIC PANEL
ALT: 24 U/L (ref 0–53)
AST: 22 U/L (ref 0–37)
Albumin: 4.5 g/dL (ref 3.5–5.2)
Alkaline Phosphatase: 50 U/L (ref 39–117)
BILIRUBIN TOTAL: 0.5 mg/dL (ref 0.2–1.2)
BUN: 12 mg/dL (ref 6–23)
CALCIUM: 9.7 mg/dL (ref 8.4–10.5)
CHLORIDE: 103 meq/L (ref 96–112)
CO2: 30 meq/L (ref 19–32)
Creatinine, Ser: 1.12 mg/dL (ref 0.40–1.50)
GFR: 71.66 mL/min (ref >60.00)
GLUCOSE: 96 mg/dL (ref 70–99)
Potassium: 4.7 mEq/L (ref 3.5–5.1)
SODIUM: 138 meq/L (ref 135–145)
TOTAL PROTEIN: 7.3 g/dL (ref 6.0–8.3)

## 2015-02-06 LAB — PSA: PSA: 0.4 ng/mL (ref 0.10–4.00)

## 2015-02-06 LAB — VITAMIN B12: Vitamin B-12: 184 pg/mL — ABNORMAL LOW (ref 211–911)

## 2015-02-12 ENCOUNTER — Encounter: Payer: Self-pay | Admitting: Family Medicine

## 2015-02-12 ENCOUNTER — Ambulatory Visit (INDEPENDENT_AMBULATORY_CARE_PROVIDER_SITE_OTHER): Payer: BLUE CROSS/BLUE SHIELD | Admitting: Family Medicine

## 2015-02-12 VITALS — BP 124/82 | HR 72 | Temp 98.4°F | Ht 68.5 in | Wt 213.8 lb

## 2015-02-12 DIAGNOSIS — K429 Umbilical hernia without obstruction or gangrene: Secondary | ICD-10-CM | POA: Insufficient documentation

## 2015-02-12 DIAGNOSIS — Z0000 Encounter for general adult medical examination without abnormal findings: Secondary | ICD-10-CM

## 2015-02-12 DIAGNOSIS — Z1211 Encounter for screening for malignant neoplasm of colon: Secondary | ICD-10-CM

## 2015-02-12 DIAGNOSIS — E7800 Pure hypercholesterolemia, unspecified: Secondary | ICD-10-CM

## 2015-02-12 DIAGNOSIS — Z8249 Family history of ischemic heart disease and other diseases of the circulatory system: Secondary | ICD-10-CM

## 2015-02-12 DIAGNOSIS — E538 Deficiency of other specified B group vitamins: Secondary | ICD-10-CM | POA: Insufficient documentation

## 2015-02-12 DIAGNOSIS — K219 Gastro-esophageal reflux disease without esophagitis: Secondary | ICD-10-CM

## 2015-02-12 DIAGNOSIS — B351 Tinea unguium: Secondary | ICD-10-CM

## 2015-02-12 NOTE — Progress Notes (Signed)
BP 124/82 mmHg  Pulse 72  Temp(Src) 98.4 F (36.9 C) (Oral)  Ht 5' 8.5" (1.74 m)  Wt 213 lb 12 oz (96.956 kg)  BMI 32.02 kg/m2   CC: CPE  Subjective:    Patient ID: Thomas Church, male    DOB: 06/14/1956, 59 y.o.   MRN: [id number]  HPI: Thomas Church is a 59 y.o. male presenting on 02/12/2015 for Annual Exam   HLD - compliant with vytorin. Wife worried about statins and memory. Pt also worried about memory. Ran out Sunday. Requests 1 mo trial off med to see if memory gets better.   GERD ? Barrett's - compliant with omeprazole 20mg  daily.  Low B12 - denies paresthesias or fatigue.   Umbilical hernia - enlarged after bronchitis with coughing fits. Present for at least last 4-5 yrs. Mildly tender intermittently.  Preventative: Colon screening - would like to continue iFOB. Normal in past Prostate screening - discussed. would like yearly check.  Tdap 2013  Caffeine: 2 cups coffee/day, some sodas/tea  Married//Remarried - 2 children - one by first wife/ 1 son  Occupation: Glass blower/designertoner salesman Activity: no regular exercise  Diet: increasing water, fruits/vegetables daily   Relevant past medical, surgical, family and social history reviewed and updated as indicated. Interim medical history since our last visit reviewed. Allergies and medications reviewed and updated. Current Outpatient Prescriptions on File Prior to Visit  Medication Sig  . ezetimibe-simvastatin (VYTORIN) 10-40 MG per tablet TAKE ONE TABLET BY MOUTH AT BEDTIME  . omeprazole (PRILOSEC OTC) 20 MG tablet Take 20 mg by mouth daily.    . sildenafil (REVATIO) 20 MG tablet Take 2-5 tablets (40-100 mg total) by mouth daily as needed.  . terbinafine (LAMISIL) 250 MG tablet Take 1 tablet (250 mg total) by mouth daily.   No current facility-administered medications on file prior to visit.    Review of Systems  Constitutional: Negative for fever, chills, activity change, appetite change, fatigue and unexpected weight change.    HENT: Negative for hearing loss.   Eyes: Negative for visual disturbance.  Respiratory: Positive for shortness of breath (mild, weight related ?deconditioning, doesn't affect daily activities). Negative for cough, chest tightness and wheezing.   Cardiovascular: Negative for chest pain, palpitations and leg swelling.  Gastrointestinal: Negative for nausea, vomiting, abdominal pain, diarrhea, constipation, blood in stool and abdominal distention.  Genitourinary: Negative for hematuria and difficulty urinating.  Musculoskeletal: Negative for myalgias, arthralgias and neck pain.  Skin: Negative for rash.  Neurological: Positive for dizziness (mainly when singing intensely high notes) and headaches (sinus?). Negative for seizures and syncope.  Hematological: Negative for adenopathy. Does not bruise/bleed easily.  Psychiatric/Behavioral: Negative for dysphoric mood. The patient is not nervous/anxious.    Per HPI unless specifically indicated above     Objective:    BP 124/82 mmHg  Pulse 72  Temp(Src) 98.4 F (36.9 C) (Oral)  Ht 5' 8.5" (1.74 m)  Wt 213 lb 12 oz (96.956 kg)  BMI 32.02 kg/m2  Wt Readings from Last 3 Encounters:  02/12/15 213 lb 12 oz (96.956 kg)  05/21/14 205 lb (92.987 kg)  10/11/13 216 lb 8 oz (98.204 kg)    Physical Exam  Constitutional: He is oriented to person, place, and time. He appears well-developed and well-nourished. No distress.  HENT:  Head: Normocephalic and atraumatic.  Right Ear: Hearing, tympanic membrane, external ear and ear canal normal.  Left Ear: Hearing, tympanic membrane, external ear and ear canal normal.  Nose: Nose normal.  Mouth/Throat: Uvula is midline, oropharynx is clear and moist and mucous membranes are normal. No oropharyngeal exudate, posterior oropharyngeal edema or posterior oropharyngeal erythema.  Eyes: Conjunctivae and EOM are normal. Pupils are equal, round, and reactive to light. No scleral icterus.  Neck: Normal range of  motion. Neck supple. Carotid bruit is not present. No thyromegaly present.  Cardiovascular: Normal rate, regular rhythm, normal heart sounds and intact distal pulses.   No murmur heard. Pulses:      Radial pulses are 2+ on the right side, and 2+ on the left side.  Pulmonary/Chest: Effort normal and breath sounds normal. No respiratory distress. He has no wheezes. He has no rales.  Abdominal: Soft. Bowel sounds are normal. He exhibits no distension and no mass. There is no tenderness. There is no rebound and no guarding. A hernia (umbilical) is present.  Large reducible but mildly tender umbilical hernia  Genitourinary: Rectum normal and prostate normal. Rectal exam shows no external hemorrhoid, no internal hemorrhoid, no fissure, no mass, no tenderness and anal tone normal. Prostate is not enlarged (15gm) and not tender.  Musculoskeletal: Normal range of motion. He exhibits no edema.  Lymphadenopathy:    He has no cervical adenopathy.  Neurological: He is alert and oriented to person, place, and time.  CN grossly intact, station and gait intact  Skin: Skin is warm and dry. No rash noted.  Psychiatric: He has a normal mood and affect. His behavior is normal. Judgment and thought content normal.  Nursing note and vitals reviewed.  Results for orders placed or performed in visit on 02/06/15  Lipid panel  Result Value Ref Range   Cholesterol 148 0 - 200 mg/dL   Triglycerides 16.1 0.0 - 149.0 mg/dL   HDL 09.60 >45.40 mg/dL   VLDL 98.1 0.0 - 19.1 mg/dL   LDL Cholesterol 77 0 - 99 mg/dL   Total CHOL/HDL Ratio 3    NonHDL 94.20   Comprehensive metabolic panel  Result Value Ref Range   Sodium 138 135 - 145 mEq/L   Potassium 4.7 3.5 - 5.1 mEq/L   Chloride 103 96 - 112 mEq/L   CO2 30 19 - 32 mEq/L   Glucose, Bld 96 70 - 99 mg/dL   BUN 12 6 - 23 mg/dL   Creatinine, Ser 4.78 0.40 - 1.50 mg/dL   Total Bilirubin 0.5 0.2 - 1.2 mg/dL   Alkaline Phosphatase 50 39 - 117 U/L   AST 22 0 - 37 U/L    ALT 24 0 - 53 U/L   Total Protein 7.3 6.0 - 8.3 g/dL   Albumin 4.5 3.5 - 5.2 g/dL   Calcium 9.7 8.4 - 29.5 mg/dL   GFR 62.13 >08.65 mL/min  PSA  Result Value Ref Range   PSA 0.40 0.10 - 4.00 ng/mL  Vitamin B12  Result Value Ref Range   Vitamin B-12 184 (L) 211 - 911 pg/mL      Assessment & Plan:   Problem List Items Addressed This Visit    Vitamin B12 deficiency    Anticipate from chronic PPI use. Start b12 1000 mg daily OTC.      Umbilical hernia without obstruction and without gangrene    Refer to surgery for further eval and discussion.      Relevant Orders   Ambulatory referral to General Surgery   Onychomycosis    Followed by podiatry      HYPERCHOLESTEROLEMIA    Discussed vytorin. Pt requests trial off med for 1 month then  will reassess. If no change in memory, rec restart this. If improved memory, consider zetia alone and recheck FLP 3-4 mo.      Relevant Medications   aspirin 81 MG EC tablet   Healthcare maintenance - Primary    Preventative protocols reviewed and updated unless pt declined. Discussed healthy diet and lifestyle.       GERD (gastroesophageal reflux disease)    Continue omeprazole  daily. No breakthrough sxs.      Family history of early CAD    Start ASA  MWF. Pt elects to hold vytorin for 1 mo, will call us with update.       Other Visit Diagnoses    Special screening for malignant neoplasms, colon        Relevant Orders    Fecal occult blood, imunochemical        Follow up plan: Return in about 1 year (around 02/12/2016), or as needed, for annual exam, prior fasting for blood work.

## 2015-02-12 NOTE — Assessment & Plan Note (Signed)
Refer to surgery for further eval and discussion.

## 2015-02-12 NOTE — Assessment & Plan Note (Signed)
Start ASA 81mg  MWF. Pt elects to hold vytorin for 1 mo, will call us with update.

## 2015-02-12 NOTE — Assessment & Plan Note (Signed)
Preventative protocols reviewed and updated unless pt declined. Discussed healthy diet and lifestyle.  

## 2015-02-12 NOTE — Assessment & Plan Note (Addendum)
Continue omeprazole 20mg  daily. No breakthrough sxs.

## 2015-02-12 NOTE — Assessment & Plan Note (Addendum)
Discussed vytorin. Pt requests trial off med for 1 month then will reassess. If no change in memory, rec restart this. If improved memory, consider zetia alone and recheck FLP 3-4 mo.

## 2015-02-12 NOTE — Progress Notes (Signed)
Pre visit review using our clinic review tool, if applicable. No additional management support is needed unless otherwise documented below in the visit note. 

## 2015-02-12 NOTE — Assessment & Plan Note (Signed)
Anticipate from chronic PPI use. Start b12 1000 mg daily OTC.

## 2015-02-12 NOTE — Assessment & Plan Note (Signed)
Followed by podiatry 

## 2015-02-12 NOTE — Patient Instructions (Addendum)
Let's trial off vytorin for 1 month. If memory improvement noted, let me know for change in med. If no memory improvement noted, restart.  Start aspirin 29m MWF Pass by lab to pick up stool kit. Consider joining [employer] for regular exercise in your routine. Start b12 1000 mg daily (over the counter). We will call you to schedule surgery referral for evaluation of umbilical hernia.

## 2015-02-20 ENCOUNTER — Ambulatory Visit (INDEPENDENT_AMBULATORY_CARE_PROVIDER_SITE_OTHER): Payer: BLUE CROSS/BLUE SHIELD | Admitting: Podiatry

## 2015-02-20 ENCOUNTER — Encounter: Payer: Self-pay | Admitting: Podiatry

## 2015-02-20 VITALS — BP 117/78 | HR 92 | Resp 16

## 2015-02-20 DIAGNOSIS — Z79899 Other long term (current) drug therapy: Secondary | ICD-10-CM

## 2015-02-20 DIAGNOSIS — L603 Nail dystrophy: Secondary | ICD-10-CM | POA: Diagnosis not present

## 2015-02-25 NOTE — Progress Notes (Signed)
He presents today for follow-up of his onychomycosis. He states that his toenails are looking great.  Objective: Vital signs are stable he's alert and oriented 3. There is no erythema edema saline as drainage or odor his nails appear to be healing quite nicely he is 98% grown out.  Assessment: 1 healing onychomycosis bilateral.  Plan: Follow up with me as needed for recurrence.

## 2015-10-29 ENCOUNTER — Encounter (HOSPITAL_COMMUNITY): Payer: Self-pay

## 2015-10-29 ENCOUNTER — Ambulatory Visit: Payer: Self-pay | Admitting: Surgery

## 2015-10-29 ENCOUNTER — Encounter (HOSPITAL_COMMUNITY)
Admission: RE | Admit: 2015-10-29 | Discharge: 2015-10-29 | Disposition: A | Discharge status: Home / Self Care | Payer: BLUE CROSS/BLUE SHIELD | Source: Ambulatory Visit | Attending: Surgery | Admitting: Surgery

## 2015-10-29 DIAGNOSIS — K429 Umbilical hernia without obstruction or gangrene: Secondary | ICD-10-CM | POA: Diagnosis present

## 2015-10-29 DIAGNOSIS — K219 Gastro-esophageal reflux disease without esophagitis: Secondary | ICD-10-CM | POA: Diagnosis not present

## 2015-10-29 DIAGNOSIS — Z7982 Long term (current) use of aspirin: Secondary | ICD-10-CM | POA: Diagnosis not present

## 2015-10-29 DIAGNOSIS — Z79899 Other long term (current) drug therapy: Secondary | ICD-10-CM | POA: Diagnosis not present

## 2015-10-29 DIAGNOSIS — E7800 Pure hypercholesterolemia, unspecified: Secondary | ICD-10-CM | POA: Diagnosis not present

## 2015-10-29 DIAGNOSIS — Z791 Long term (current) use of non-steroidal anti-inflammatories (NSAID): Secondary | ICD-10-CM | POA: Diagnosis not present

## 2015-10-29 DIAGNOSIS — Z87891 Personal history of nicotine dependence: Secondary | ICD-10-CM | POA: Diagnosis not present

## 2015-10-29 DIAGNOSIS — K420 Umbilical hernia with obstruction, without gangrene: Secondary | ICD-10-CM | POA: Diagnosis not present

## 2015-10-29 HISTORY — DX: Dizziness and giddiness: R42

## 2015-10-29 HISTORY — DX: Presence of spectacles and contact lenses: Z97.3

## 2015-10-29 HISTORY — DX: Unspecified amblyopia, unspecified eye: H53.009

## 2015-10-29 LAB — CBC
HEMATOCRIT: 44.3 % (ref 39.0–52.0)
HEMOGLOBIN: 15.5 g/dL (ref 13.0–17.0)
MCH: 31.8 pg (ref 26.0–34.0)
MCHC: 35 g/dL (ref 30.0–36.0)
MCV: 91 fL (ref 78.0–100.0)
Platelets: 248 10*3/uL (ref 150–400)
RBC: 4.87 MIL/uL (ref 4.22–5.81)
RDW: 12.6 % (ref 11.5–15.5)
WBC: 7.9 10*3/uL (ref 4.0–10.5)

## 2015-10-29 LAB — BASIC METABOLIC PANEL
Anion gap: 9 (ref 5–15)
BUN: 14 mg/dL (ref 6–20)
CHLORIDE: 106 mmol/L (ref 101–111)
CO2: 27 mmol/L (ref 22–32)
CREATININE: 1.01 mg/dL (ref 0.61–1.24)
Calcium: 9.5 mg/dL (ref 8.9–10.3)
GFR calc Af Amer: >60 mL/min (ref >60)
GFR calc non Af Amer: >60 mL/min (ref >60)
GLUCOSE: 102 mg/dL — AB (ref 65–99)
Potassium: 4.1 mmol/L (ref 3.5–5.1)
Sodium: 142 mmol/L (ref 135–145)

## 2015-10-29 NOTE — Patient Instructions (Signed)
[facility]  10/29/2015   Your procedure is scheduled on: Thursday October 30, 2015   [facility]  Entrance take [place]  elevators to 3rd floor to  Short Stay Center at 5:30 AM.  Call this number if you have problems the morning of surgery [phone]   Remember: ONLY 1 PERSON MAY GO WITH YOU TO SHORT STAY TO GET  READY MORNING OF YOUR SURGERY.  Do not eat food or drink liquids :After Midnight.     Take these medicines the morning of surgery with A SIP OF WATER: Omeprazole (Prilosec)                                You may not have any metal on your body including hair pins and              piercings  Do not wear jewelry, lotions, powders or colognes, deodorant                           Men may shave face and neck.   Do not bring valuables to the hospital. [facility] IS NOT             RESPONSIBLE   FOR VALUABLES.  Contacts, dentures or bridgework may not be worn into surgery.      Patients discharged the day of surgery will not be allowed to drive home.  Name and phone number of your driver:[facility] (wife)  _____________________________________________________________________             [facility] Before surgery, you can play an important role.  Because skin is not sterile, your skin needs to be as free of germs as possible.  You can reduce the number of germs on your skin by washing with CHG (chlorahexidine gluconate) soap before surgery.  CHG is an antiseptic cleaner which kills germs and bonds with the skin to continue killing germs even after washing. Please DO NOT use if you have an allergy to CHG or antibacterial soaps.  If your skin becomes reddened/irritated stop using the CHG and inform your nurse when you arrive at Short Stay. Do not shave (including legs and underarms) for at least 48 hours prior to the first CHG shower.  You may shave your face/neck. Please follow these instructions carefully:  1.   Shower with CHG Soap the night before surgery and the  morning of Surgery.  2.  If you choose to wash your hair, wash your hair first as usual with your  normal  shampoo.  3.  After you shampoo, rinse your hair and body thoroughly to remove the  shampoo.                           4.  Use CHG as you would any other liquid soap.  You can apply chg directly  to the skin and wash                       Gently with a scrungie or clean washcloth.  5.  Apply the CHG Soap to your body ONLY FROM THE NECK DOWN.   Do not use on face/ open  Wound or open sores. Avoid contact with eyes, ears mouth and genitals (private parts).                       Wash face,  Genitals (private parts) with your normal soap.             6.  Wash thoroughly, paying special attention to the area where your surgery  will be performed.  7.  Thoroughly rinse your body with warm water from the neck down.  8.  DO NOT shower/wash with your normal soap after using and rinsing off  the CHG Soap.                9.  Pat yourself dry with a clean towel.            10.  Wear clean pajamas.            11.  Place clean sheets on your bed the night of your first shower and do not  sleep with pets. Day of Surgery : Do not apply any lotions/deodorants the morning of surgery.  Please wear clean clothes to the hospital/surgery center.  FAILURE TO FOLLOW THESE INSTRUCTIONS MAY RESULT IN THE CANCELLATION OF YOUR SURGERY PATIENT SIGNATURE_________________________________  NURSE SIGNATURE__________________________________  ________________________________________________________________________

## 2015-10-29 NOTE — Anesthesia Preprocedure Evaluation (Addendum)
Anesthesia Evaluation  Patient identified by MRN, date of birth, ID band Patient awake    Reviewed: Allergy & Precautions, NPO status , Patient's Chart, lab work & pertinent test results  History of Anesthesia Complications (+) PONVNegative for: history of anesthetic complications  Airway Mallampati: II  TM Distance: >3 FB Neck ROM: Full    Dental  (+) Teeth Intact, Dental Advisory Given, Caps   Pulmonary former smoker,    Pulmonary exam normal        Cardiovascular negative cardio ROS Normal cardiovascular exam     Neuro/Psych negative neurological ROS  negative psych ROS   GI/Hepatic GERD  ,  Endo/Other  negative endocrine ROS  Renal/GU negative Renal ROS     Musculoskeletal   Abdominal   Peds  Hematology   Anesthesia Other Findings   Reproductive/Obstetrics                            Anesthesia Physical Anesthesia Plan  ASA: II  Anesthesia Plan: General   Post-op Pain Management:    Induction: Intravenous  Airway Management Planned: Oral ETT  Additional Equipment:   Intra-op Plan:   Post-operative Plan: Extubation in OR  Informed Consent: I have reviewed the patients History and Physical, chart, labs and discussed the procedure including the risks, benefits and alternatives for the proposed anesthesia with the patient or authorized representative who has indicated his/her understanding and acceptance.   Dental advisory given  Plan Discussed with: CRNA, Anesthesiologist and Surgeon  Anesthesia Plan Comments:        Anesthesia Quick Evaluation

## 2015-10-29 NOTE — H&P (Signed)
Chief Complaint:  Incarcerated and increasing in size umbilical hernia  History of Present Illness:  Thomas Church is an 59 y.o. male who presented to the office last week with an increasing in size and incarcerated umbilical hernia.  He desires repair and this has been explained to him.   Past Medical History  Diagnosis Date  . Chest pain     Exercise echo in 2002 was normal per patient's report. ETT-myoview (2/11):8'35", no chest pain, no ischemic ECG changes, perfusion images showed no ischemia or infarction, per pt bad muscle cramp  . Hypercholesteremia   . Esophageal reflux     controlled on OTC omeprazole  . Erectile dysfunction   . Barrett's esophagus     ?  Marland Kitchen Wears glasses   . Lazy eye     right eye   . Dizziness     Past Surgical History  Procedure Laterality Date  . Hand surgery      Right, due to olaceration thenar eminence  . Esophagogastroduodenoscopy  01/08/1997    stricture stretched; GERD  . Exercise echo  03/09/2001    normal  . Femoral hernia repair  01/06/2005    left incarcerated (Dr. Hassell Done)  . Ett  12/2009    no chest pain, no ischemic ECG changes, perfusion images showed no ischemia or infarction, per pt bad muscle cramp    Current Outpatient Prescriptions  Medication Sig Dispense Refill  . aspirin 81 MG EC tablet Take 81 mg by mouth at bedtime. Swallow whole.    . ibuprofen (ADVIL,MOTRIN) 200 MG tablet Take 400-800 mg by mouth every 6 (six) hours as needed for headache or mild pain.    Marland Kitchen omeprazole (PRILOSEC OTC) 20 MG tablet Take 20 mg by mouth as directed. Takes every morning and at night if needed for indigestion    . sildenafil (REVATIO) 20 MG tablet Take 2-5 tablets (40-100 mg total) by mouth daily as needed. (Patient taking differently: Take 60-80 mg by mouth daily as needed (erectile dysfunction). ) 50 tablet 0  . vitamin B-12 (CYANOCOBALAMIN) 1000 MCG tablet Take 2,000 mcg by mouth daily.      No current facility-administered medications for  this visit.   Review of patient's allergies indicates no known allergies. Family History  Problem Relation Age of Onset  . Other Mother     Giant cell arteritis  . Heart disease Father 29    Mi; CABG x 2, AAA varices  . HIV Father     2nd to 1st CABG  . Hypertension Father   . Cancer Daughter     leukemia  . Stroke Other     remote  . Diabetes Paternal Uncle    Social History:   reports that he quit smoking about 14 years ago. His smoking use included Cigarettes, Pipe, and Cigars. He has a 30 pack-year smoking history. He has never used smokeless tobacco. He reports that he does not drink alcohol or use illicit drugs.   REVIEW OF SYSTEMS : Negative except for see problem list  Physical Exam:   There were no vitals taken for this visit. There is no weight on file to calculate BMI.  Gen:  WDWN WM NAD  Neurological: Alert and oriented to person, place, and time. Motor and sensory function is grossly intact  Head: Normocephalic and atraumatic.  Eyes: Conjunctivae are normal. Pupils are equal, round, and reactive to light. No scleral icterus.  Neck: Normal range of motion. Neck supple. No tracheal deviation or  thyromegaly present.  Cardiovascular:  SR without murmurs or gallops.  No carotid bruits Breast:  Not examined Respiratory: Effort normal.  No respiratory distress. No chest wall tenderness. Breath sounds normal.  No wheezes, rales or rhonchi.  Abdomen:  Large umbilica hernia that is unable to completely reduce and likely contains incarcerated fat GU:  Not examined Musculoskeletal: Normal range of motion. Extremities are nontender. No cyanosis, edema or clubbing noted Lymphadenopathy: No cervical, preauricular, postauricular or axillary adenopathy is present Skin: Skin is warm and dry. No rash noted. No diaphoresis. No erythema. No pallor. Pscyh: Normal mood and affect. Behavior is normal. Judgment and thought content normal.   LABORATORY RESULTS: Results for orders placed  or performed during the hospital encounter of 10/29/15 (from the past 48 hour(s))  CBC     Status: None   Collection Time: 10/29/15 10:00 AM  Result Value Ref Range   WBC 7.9 4.0 - 10.5 K/uL   RBC 4.87 4.22 - 5.81 MIL/uL   Hemoglobin 15.5 13.0 - 17.0 g/dL   HCT 44.3 39.0 - 52.0 %   MCV 91.0 78.0 - 100.0 fL   MCH 31.8 26.0 - 34.0 pg   MCHC 35.0 30.0 - 36.0 g/dL   RDW 12.6 11.5 - 15.5 %   Platelets 248 150 - 400 K/uL  Basic metabolic panel     Status: Abnormal   Collection Time: 10/29/15 10:00 AM  Result Value Ref Range   Sodium 142 135 - 145 mmol/L   Potassium 4.1 3.5 - 5.1 mmol/L   Chloride 106 101 - 111 mmol/L   CO2 27 22 - 32 mmol/L   Glucose, Bld 102 (H) 65 - 99 mg/dL   BUN 14 6 - 20 mg/dL   Creatinine, Ser 1.01 0.61 - 1.24 mg/dL   Calcium 9.5 8.9 - 10.3 mg/dL   GFR calc non Af Amer >60 >60 mL/min   GFR calc Af Amer >60 >60 mL/min    Comment: (NOTE) The eGFR has been calculated using the CKD EPI equation. This calculation has not been validated in all clinical situations. eGFR's persistently <60 mL/min signify possible Chronic Kidney Disease.    Anion gap 9 5 - 15     RADIOLOGY RESULTS: No results found.  Problem List: Patient Active Problem List   Diagnosis Date Noted  . Umbilical hernia without obstruction and without gangrene 02/12/2015  . Vitamin B12 deficiency 02/12/2015  . Skin rash 05/21/2013  . Family history of early CAD 10/18/2012  . Healthcare maintenance 05/15/2012  . Cutaneous skin tags 05/15/2012  . Onychomycosis 05/15/2012  . WEIGHT GAIN 12/10/2010  . GERD (gastroesophageal reflux disease) 12/04/2009  . HYPERCHOLESTEROLEMIA 11/24/2007  . ERECTILE DYSFUNCTION 11/24/2007    Assessment & Plan: Large umbilical hernia for lap assisted umbilical hernia repair.      Matt B. Hassell Done, MD, [facility] [phone] beeper [phone]  10/29/2015 11:04 AM

## 2015-10-30 ENCOUNTER — Encounter (HOSPITAL_COMMUNITY): Payer: Self-pay | Admitting: *Deleted

## 2015-10-30 ENCOUNTER — Ambulatory Visit (HOSPITAL_COMMUNITY): Payer: BLUE CROSS/BLUE SHIELD | Admitting: Anesthesiology

## 2015-10-30 ENCOUNTER — Encounter (HOSPITAL_COMMUNITY)
Admission: RE | Disposition: A | Discharge status: Home / Self Care | Payer: Self-pay | Source: Ambulatory Visit | Attending: Surgery

## 2015-10-30 ENCOUNTER — Ambulatory Visit (HOSPITAL_COMMUNITY)
Admission: RE | Admit: 2015-10-30 | Discharge: 2015-10-30 | Disposition: A | Discharge status: Home / Self Care | Payer: BLUE CROSS/BLUE SHIELD | Source: Ambulatory Visit | Attending: Surgery | Admitting: Surgery

## 2015-10-30 DIAGNOSIS — Z87891 Personal history of nicotine dependence: Secondary | ICD-10-CM | POA: Insufficient documentation

## 2015-10-30 DIAGNOSIS — E7800 Pure hypercholesterolemia, unspecified: Secondary | ICD-10-CM | POA: Insufficient documentation

## 2015-10-30 DIAGNOSIS — Z9889 Other specified postprocedural states: Secondary | ICD-10-CM

## 2015-10-30 DIAGNOSIS — K420 Umbilical hernia with obstruction, without gangrene: Secondary | ICD-10-CM | POA: Diagnosis not present

## 2015-10-30 DIAGNOSIS — K219 Gastro-esophageal reflux disease without esophagitis: Secondary | ICD-10-CM | POA: Insufficient documentation

## 2015-10-30 DIAGNOSIS — Z79899 Other long term (current) drug therapy: Secondary | ICD-10-CM | POA: Insufficient documentation

## 2015-10-30 DIAGNOSIS — Z791 Long term (current) use of non-steroidal anti-inflammatories (NSAID): Secondary | ICD-10-CM | POA: Insufficient documentation

## 2015-10-30 DIAGNOSIS — Z7982 Long term (current) use of aspirin: Secondary | ICD-10-CM | POA: Insufficient documentation

## 2015-10-30 DIAGNOSIS — Z8719 Personal history of other diseases of the digestive system: Secondary | ICD-10-CM

## 2015-10-30 HISTORY — PX: UMBILICAL HERNIA REPAIR: SHX196

## 2015-10-30 HISTORY — PX: INSERTION OF MESH: SHX5868

## 2015-10-30 SURGERY — REPAIR, HERNIA, UMBILICAL, LAPAROSCOPIC
Anesthesia: General

## 2015-10-30 MED ORDER — BUPIVACAINE LIPOSOME 1.3 % IJ SUSP
INTRAMUSCULAR | Status: DC | PRN
  Administered 2015-10-30: 20 mL

## 2015-10-30 MED ORDER — SODIUM CHLORIDE 0.9 % IJ SOLN
INTRAMUSCULAR | Status: AC
  Filled 2015-10-30: qty 20

## 2015-10-30 MED ORDER — SODIUM CHLORIDE 0.9 % IJ SOLN: 3.0000 mL | INTRAMUSCULAR | Status: DC | PRN

## 2015-10-30 MED ORDER — LACTATED RINGERS IV SOLN
INTRAVENOUS | Status: DC | PRN
  Administered 2015-10-30 (×2): via INTRAVENOUS

## 2015-10-30 MED ORDER — DEXAMETHASONE SODIUM PHOSPHATE 10 MG/ML IJ SOLN
INTRAMUSCULAR | Status: DC | PRN
  Administered 2015-10-30: 10 mg via INTRAVENOUS

## 2015-10-30 MED ORDER — FENTANYL CITRATE (PF) 250 MCG/5ML IJ SOLN
INTRAMUSCULAR | Status: AC
  Filled 2015-10-30: qty 5

## 2015-10-30 MED ORDER — HYDROMORPHONE HCL 1 MG/ML IJ SOLN
0.2500 mg | INTRAMUSCULAR | Status: DC | PRN
  Administered 2015-10-30 (×4): 0.5 mg via INTRAVENOUS

## 2015-10-30 MED ORDER — ACETAMINOPHEN 325 MG PO TABS: 650.0000 mg | ORAL_TABLET | ORAL | Status: DC | PRN

## 2015-10-30 MED ORDER — FENTANYL CITRATE (PF) 100 MCG/2ML IJ SOLN
INTRAMUSCULAR | Status: DC | PRN
  Administered 2015-10-30: 50 ug via INTRAVENOUS
  Administered 2015-10-30 (×2): 100 ug via INTRAVENOUS

## 2015-10-30 MED ORDER — HEPARIN SODIUM (PORCINE) 5000 UNIT/ML IJ SOLN
5000.0000 [IU] | Freq: Once | INTRAMUSCULAR | Status: AC
Start: 2015-10-30 — End: 2015-10-30
  Administered 2015-10-30: 5000 [IU] via SUBCUTANEOUS
  Filled 2015-10-30: qty 1

## 2015-10-30 MED ORDER — CHLORHEXIDINE GLUCONATE 4 % EX LIQD: 1.0000 "application " | Freq: Once | CUTANEOUS | Status: DC

## 2015-10-30 MED ORDER — PHENYLEPHRINE 40 MCG/ML (10ML) SYRINGE FOR IV PUSH (FOR BLOOD PRESSURE SUPPORT)
PREFILLED_SYRINGE | INTRAVENOUS | Status: AC
  Filled 2015-10-30: qty 10

## 2015-10-30 MED ORDER — CEFAZOLIN SODIUM-DEXTROSE 2-3 GM-% IV SOLR
INTRAVENOUS | Status: AC
  Filled 2015-10-30: qty 50

## 2015-10-30 MED ORDER — ATROPINE SULFATE 0.4 MG/ML IJ SOLN
INTRAMUSCULAR | Status: AC
  Filled 2015-10-30: qty 1

## 2015-10-30 MED ORDER — EPHEDRINE SULFATE 50 MG/ML IJ SOLN
INTRAMUSCULAR | Status: DC | PRN
  Administered 2015-10-30: 5 mg via INTRAVENOUS

## 2015-10-30 MED ORDER — ACETAMINOPHEN 650 MG RE SUPP
650.0000 mg | RECTAL | Status: DC | PRN
  Filled 2015-10-30: qty 1

## 2015-10-30 MED ORDER — PROMETHAZINE HCL 25 MG/ML IJ SOLN: 6.2500 mg | INTRAMUSCULAR | Status: DC | PRN

## 2015-10-30 MED ORDER — ROCURONIUM BROMIDE 100 MG/10ML IV SOLN
INTRAVENOUS | Status: DC | PRN
  Administered 2015-10-30: 50 mg via INTRAVENOUS
  Administered 2015-10-30: 20 mg via INTRAVENOUS

## 2015-10-30 MED ORDER — MIDAZOLAM HCL 5 MG/5ML IJ SOLN
INTRAMUSCULAR | Status: DC | PRN
  Administered 2015-10-30: 2 mg via INTRAVENOUS

## 2015-10-30 MED ORDER — LIDOCAINE HCL (CARDIAC) 20 MG/ML IV SOLN
INTRAVENOUS | Status: AC
  Filled 2015-10-30: qty 5

## 2015-10-30 MED ORDER — LIDOCAINE HCL (PF) 2 % IJ SOLN
INTRAMUSCULAR | Status: DC | PRN
  Administered 2015-10-30: 75 mg via INTRADERMAL

## 2015-10-30 MED ORDER — LACTATED RINGERS IV SOLN
INTRAVENOUS | Status: DC | PRN
  Administered 2015-10-30: 1000 mL

## 2015-10-30 MED ORDER — PROPOFOL 10 MG/ML IV BOLUS
INTRAVENOUS | Status: DC | PRN
  Administered 2015-10-30: 200 mg via INTRAVENOUS

## 2015-10-30 MED ORDER — MIDAZOLAM HCL 2 MG/2ML IJ SOLN
INTRAMUSCULAR | Status: AC
  Filled 2015-10-30: qty 2

## 2015-10-30 MED ORDER — SODIUM CHLORIDE 0.9 % IV SOLN: 250.0000 mL | INTRAVENOUS | Status: DC | PRN

## 2015-10-30 MED ORDER — CEFAZOLIN SODIUM-DEXTROSE 2-3 GM-% IV SOLR
2.0000 g | INTRAVENOUS | Status: AC
  Administered 2015-10-30: 2 g via INTRAVENOUS

## 2015-10-30 MED ORDER — OXYCODONE HCL 5 MG PO TABS
5.0000 mg | ORAL_TABLET | ORAL | Status: DC | PRN
  Administered 2015-10-30: 5 mg via ORAL
  Filled 2015-10-30: qty 1

## 2015-10-30 MED ORDER — ONDANSETRON HCL 4 MG/2ML IJ SOLN
INTRAMUSCULAR | Status: DC | PRN
  Administered 2015-10-30: 4 mg via INTRAVENOUS

## 2015-10-30 MED ORDER — EPHEDRINE SULFATE 50 MG/ML IJ SOLN
INTRAMUSCULAR | Status: AC
  Filled 2015-10-30: qty 1

## 2015-10-30 MED ORDER — SCOPOLAMINE 1 MG/3DAYS TD PT72
1.0000 | MEDICATED_PATCH | TRANSDERMAL | Status: DC
  Administered 2015-10-30: 1.5 mg via TRANSDERMAL
  Filled 2015-10-30: qty 1

## 2015-10-30 MED ORDER — DEXAMETHASONE SODIUM PHOSPHATE 10 MG/ML IJ SOLN
INTRAMUSCULAR | Status: AC
  Filled 2015-10-30: qty 1

## 2015-10-30 MED ORDER — SODIUM CHLORIDE 0.9 % IJ SOLN
INTRAMUSCULAR | Status: AC
  Filled 2015-10-30: qty 10

## 2015-10-30 MED ORDER — SUGAMMADEX SODIUM 200 MG/2ML IV SOLN
INTRAVENOUS | Status: AC
  Filled 2015-10-30: qty 2

## 2015-10-30 MED ORDER — SUGAMMADEX SODIUM 200 MG/2ML IV SOLN
INTRAVENOUS | Status: DC | PRN
  Administered 2015-10-30: 200 mg via INTRAVENOUS

## 2015-10-30 MED ORDER — SUCCINYLCHOLINE CHLORIDE 20 MG/ML IJ SOLN
INTRAMUSCULAR | Status: DC | PRN
  Administered 2015-10-30: 100 mg via INTRAVENOUS

## 2015-10-30 MED ORDER — BUPIVACAINE-EPINEPHRINE 0.25% -1:200000 IJ SOLN
INTRAMUSCULAR | Status: AC
  Filled 2015-10-30: qty 1

## 2015-10-30 MED ORDER — HYDROCODONE-ACETAMINOPHEN 5-325 MG PO TABS: 1.0000 | ORAL_TABLET | ORAL | Status: DC | PRN

## 2015-10-30 MED ORDER — SODIUM CHLORIDE 0.9 % IJ SOLN: 3.0000 mL | Freq: Two times a day (BID) | INTRAMUSCULAR | Status: DC

## 2015-10-30 MED ORDER — ONDANSETRON HCL 4 MG/2ML IJ SOLN
INTRAMUSCULAR | Status: AC
  Filled 2015-10-30: qty 2

## 2015-10-30 MED ORDER — PROPOFOL 10 MG/ML IV BOLUS
INTRAVENOUS | Status: AC
  Filled 2015-10-30: qty 20

## 2015-10-30 MED ORDER — ROCURONIUM BROMIDE 100 MG/10ML IV SOLN
INTRAVENOUS | Status: AC
  Filled 2015-10-30: qty 1

## 2015-10-30 MED ORDER — HYDROMORPHONE HCL 1 MG/ML IJ SOLN
INTRAMUSCULAR | Status: AC
  Filled 2015-10-30: qty 1

## 2015-10-30 MED ORDER — BUPIVACAINE LIPOSOME 1.3 % IJ SUSP
20.0000 mL | Freq: Once | INTRAMUSCULAR | Status: DC
  Filled 2015-10-30: qty 20

## 2015-10-30 SURGICAL SUPPLY — 47 items
ADH SKN CLS APL DERMABOND .7 (GAUZE/BANDAGES/DRESSINGS) ×1
BALL CTTN STRL GZE (GAUZE/BANDAGES/DRESSINGS) ×1
BINDER ABDOMINAL 12 ML 46-62 (SOFTGOODS) IMPLANT
COTTON BALL STERILE (GAUZE/BANDAGES/DRESSINGS) ×2
COTTON BALL STERILE 2 PK (GAUZE/BANDAGES/DRESSINGS) IMPLANT
COVER SURGICAL LIGHT HANDLE (MISCELLANEOUS) ×2 IMPLANT
DECANTER SPIKE VIAL GLASS SM (MISCELLANEOUS) ×2 IMPLANT
DERMABOND ADVANCED (GAUZE/BANDAGES/DRESSINGS) ×1
DERMABOND ADVANCED .7 DNX12 (GAUZE/BANDAGES/DRESSINGS) IMPLANT
DEVICE SECURE STRAP 25 ABSORB (INSTRUMENTS) IMPLANT
DEVICE TROCAR PUNCTURE CLOSURE (ENDOMECHANICALS) ×1 IMPLANT
DISSECTOR BLUNT TIP ENDO 5MM (MISCELLANEOUS) IMPLANT
DRAIN CHANNEL 19F RND (DRAIN) IMPLANT
DRAPE LAPAROSCOPIC ABDOMINAL (DRAPES) ×1 IMPLANT
ELECT PENCIL ROCKER SW 15FT (MISCELLANEOUS) IMPLANT
ELECT REM PT RETURN 9FT ADLT (ELECTROSURGICAL) ×2
ELECTRODE REM PT RTRN 9FT ADLT (ELECTROSURGICAL) ×1 IMPLANT
EVACUATOR SILICONE 100CC (DRAIN) IMPLANT
GAUZE SPONGE 4X4 12PLY STRL (GAUZE/BANDAGES/DRESSINGS) ×1 IMPLANT
GLOVE BIOGEL M 8.0 STRL (GLOVE) ×2 IMPLANT
GOWN SPEC L4 XLG W/TWL (GOWN DISPOSABLE) ×2 IMPLANT
GOWN STRL REUS W/TWL XL LVL3 (GOWN DISPOSABLE) ×6 IMPLANT
KIT BASIN OR (CUSTOM PROCEDURE TRAY) ×2 IMPLANT
MARKER SKIN DUAL TIP RULER LAB (MISCELLANEOUS) ×1 IMPLANT
MESH VENTRALEX ST 2.5 CRC MED (Mesh General) ×1 IMPLANT
NDL SPNL 22GX3.5 QUINCKE BK (NEEDLE) IMPLANT
NEEDLE SPNL 22GX3.5 QUINCKE BK (NEEDLE) ×2 IMPLANT
SCISSORS LAP 5X35 DISP (ENDOMECHANICALS) ×1 IMPLANT
SET IRRIG TUBING LAPAROSCOPIC (IRRIGATION / IRRIGATOR) IMPLANT
SHEARS HARMONIC ACE PLUS 36CM (ENDOMECHANICALS) ×1 IMPLANT
SLEEVE XCEL OPT CAN 5 100 (ENDOMECHANICALS) ×4 IMPLANT
SOLUTION ANTI FOG 6CC (MISCELLANEOUS) ×1 IMPLANT
STAPLER VISISTAT 35W (STAPLE) IMPLANT
STRIP CLOSURE SKIN 1/2X4 (GAUZE/BANDAGES/DRESSINGS) IMPLANT
SUT NOVA 0 T19/GS 22DT (SUTURE) IMPLANT
SUT NOVA NAB DX-16 0-1 5-0 T12 (SUTURE) ×1 IMPLANT
SUT VIC AB 4-0 SH 18 (SUTURE) ×2 IMPLANT
TACKER 5MM HERNIA 3.5CML NAB (ENDOMECHANICALS) ×1 IMPLANT
TAPE CLOTH SURG 4X10 WHT LF (GAUZE/BANDAGES/DRESSINGS) ×1 IMPLANT
TOWEL OR 17X26 10 PK STRL BLUE (TOWEL DISPOSABLE) ×2 IMPLANT
TOWEL OR NON WOVEN STRL DISP B (DISPOSABLE) ×2 IMPLANT
TRAY FOLEY W/METER SILVER 14FR (SET/KITS/TRAYS/PACK) ×1 IMPLANT
TRAY FOLEY W/METER SILVER 16FR (SET/KITS/TRAYS/PACK) ×2 IMPLANT
TRAY LAPAROSCOPIC (CUSTOM PROCEDURE TRAY) ×2 IMPLANT
TROCAR BLADELESS OPT 5 100 (ENDOMECHANICALS) ×2 IMPLANT
TROCAR XCEL NON-BLD 11X100MML (ENDOMECHANICALS) ×2 IMPLANT
TUBING INSUFFLATION 10FT LAP (TUBING) ×2 IMPLANT

## 2015-10-30 NOTE — Discharge Instructions (Signed)
Umbilical Herniorrhaphy, Care After Refer to this sheet in the next few weeks. These instructions provide you with information on caring for yourself after your procedure. Your health care provider may also give you more specific instructions. Your treatment has been planned according to current medical practices, but problems sometimes occur. Call your health care provider if you have any problems or questions after your procedure. HOME CARE INSTRUCTIONS  If you are given antibiotic medicine, take it as directed. Finish it even if you start to feel better.  Only take over-the-counter or prescription medicines for pain, fever, or discomfort as directed by your health care provider. Do not take aspirin. It can cause bleeding.  Do not get your surgical cut (incision) area wet unless your health care provider says it is okay.  Avoid lifting objects heavier than 10 lb (4.5 kg) for 8 weeks after surgery.  Avoid sexual activity for 5 weeks after surgery or as directed by your health care provider.  Do not drive while taking prescription pain medicine.  You may return to your other normal, daily activities after 3 days or as directed by your health care provider. SEEK MEDICAL CARE IF:  You notice blood or fluid leaking from the surgical site.  Your incision area becomes red or swollen.  Your pain at the surgical site becomes worse or is not relieved by medicine.  You have problems urinating.  You feel nauseous or vomit more than 2 days after surgery.  You notice the bulge in your abdomen returns after the procedure. SEEK IMMEDIATE MEDICAL CARE IF:  You have a fever.  You have nausea or vomiting that will not stop.   This information is not intended to replace advice given to you by your health care provider. Make sure you discuss any questions you have with your health care provider.   Document Released: 04/18/2012 Document Revised: 11/08/2014 Document Reviewed: 04/18/2012 Elsevier  Interactive Patient Education 2016 Elsevier Inc.     General Anesthesia, Adult, Care After Refer to this sheet in the next few weeks. These instructions provide you with information on caring for yourself after your procedure. Your health care provider may also give you more specific instructions. Your treatment has been planned according to current medical practices, but problems sometimes occur. Call your health care provider if you have any problems or questions after your procedure. WHAT TO EXPECT AFTER THE PROCEDURE After the procedure, it is typical to experience:  Sleepiness.  Nausea and vomiting. HOME CARE INSTRUCTIONS  For the first 24 hours after general anesthesia:  Have a responsible person with you.  Do not drive a car. If you are alone, do not take public transportation.  Do not drink alcohol.  Do not take medicine that has not been prescribed by your health care provider.  Do not sign important papers or make important decisions.  You may resume a normal diet and activities as directed by your health care provider.  Change bandages (dressings) as directed.  If you have questions or problems that seem related to general anesthesia, call the hospital and ask for the anesthetist or anesthesiologist on call. SEEK MEDICAL CARE IF:  You have nausea and vomiting that continue the day after anesthesia.  You develop a rash. SEEK IMMEDIATE MEDICAL CARE IF:   You have difficulty breathing.  You have chest pain.  You have any allergic problems.   This information is not intended to replace advice given to you by your health care provider. Make sure you  discuss any questions you have with your health care provider.   Document Released: 01/24/2001 Document Revised: 11/08/2014 Document Reviewed: 02/16/2012 Elsevier Interactive Patient Education [employer].

## 2015-10-30 NOTE — Anesthesia Procedure Notes (Signed)
Procedure Name: Intubation Date/Time: 10/30/2015 7:52 AM Performed by: Elyn PeersALLEN, Dekota Shenk J Pre-anesthesia Checklist: Patient identified, Emergency Drugs available, Suction available, Patient being monitored and Timeout performed Patient Re-evaluated:Patient Re-evaluated prior to inductionOxygen Delivery Method: Circle system utilized Preoxygenation: Pre-oxygenation with 100% oxygen Intubation Type: IV induction Ventilation: Mask ventilation with difficulty Laryngoscope Size: Miller and 3 Grade View: Grade I Tube type: Oral Tube size: 7.5 mm Number of attempts: 1 Airway Equipment and Method: Stylet Placement Confirmation: ETT inserted through vocal cords under direct vision,  positive ETCO2 and breath sounds checked- equal and bilateral Secured at: 23 cm Tube secured with: Tape Dental Injury: Teeth and Oropharynx as per pre-operative assessment  Comments: Mask difficulty due to full beard - unable to get good seal.

## 2015-10-30 NOTE — Transfer of Care (Signed)
Immediate Anesthesia Transfer of Care Note  Patient: Thomas Church  Procedure(s) Performed: Procedure(s): LAPAROSCOPIC ASSISTED INCARCERATED UMBILICAL HERNIA REPAIR (N/A) INSERTION OF MESH (N/A)  Patient Location: PACU  Anesthesia Type:General  Level of Consciousness: awake, alert  and oriented  Airway & Oxygen Therapy: Patient Spontanous Breathing and Patient connected to face mask oxygen  Post-op Assessment: Report given to RN and Post -op Vital signs reviewed and stable  Post vital signs: Reviewed and stable  Last Vitals:  Filed Vitals:   10/30/15 0530  BP: 135/90  Pulse: 92  Temp: 36.4 C  Resp: 18    Complications: No apparent anesthesia complications

## 2015-10-30 NOTE — Interval H&P Note (Signed)
History and Physical Interval Note:  10/30/2015 7:25 AM  Thomas Church  has presented today for surgery, with the diagnosis of CHRONICALLY INCARCERATED UMBILICAL HERNIA  The various methods of treatment have been discussed with the patient and family. After consideration of risks, benefits and other options for treatment, the patient has consented to  Procedure(s): LAPAROSCOPIC ASSISTED INCARCERATED UMBILICAL HERNIA REPAIR (N/A) INSERTION OF MESH (N/A) as a surgical intervention .  The patient's history has been reviewed, patient examined, no change in status, stable for surgery.  I have reviewed the patient's chart and labs.  Questions were answered to the patient's satisfaction.     Regino Fournet B

## 2015-10-30 NOTE — Anesthesia Postprocedure Evaluation (Signed)
Anesthesia Post Note  Patient: Thomas FantasiaMark J Crisco  Procedure(s) Performed: Procedure(s) (LRB): LAPAROSCOPIC ASSISTED INCARCERATED UMBILICAL HERNIA REPAIR (N/A) INSERTION OF MESH (N/A)  Patient location during evaluation: PACU Anesthesia Type: General Level of consciousness: sedated Pain management: pain level controlled Vital Signs Assessment: post-procedure vital signs reviewed and stable Respiratory status: spontaneous breathing and respiratory function stable Cardiovascular status: stable Anesthetic complications: no    Last Vitals:  Filed Vitals:   10/30/15 1000 10/30/15 1019  BP: 119/83 124/82  Pulse: 94 92  Temp: 36.7 C 36.7 C  Resp: 14 15    Last Pain:  Filed Vitals:   10/30/15 1032  PainSc: 3                  Nikolas Casher DANIEL

## 2015-10-30 NOTE — Op Note (Signed)
Surgeon: Wenda LowMatt Chinyere Galiano, MD, FACS  Asst:  none  Anes:  General endotracheal  Procedure: Laparoscopically assisted incarcerated umbilical hernia repair with 6 cm Ventralex mesh patch  Diagnosis: Incarcerated umbilical hernia  Complications: none  EBL:   minimal cc  Drains: none  Description of Procedure:  [facility]  After anesthesia was administered and the patient was prepped a timeout was performed.  Access achieved without difficulty through the left upper quadrant using a 5 mm Optiview technique.  Following insufflation, omentum was found to be incarcerated in the umbilical hernia.  I reduced this and then cut the omentum from the hernia sac.    I then made an infraumbilical incision and dissected the stretched umbilical skin from the hernia sac and then dissected the sac while under pressure down to the neck.  This fascial defect was the size of my index finger.  Through that I inserted the 6+ cm Ventralex mesh with straps.  I situated this and then sutured it in place with 4 sutures of #1 Novafil closing the fascia and incorporating mesh into the suture.  I then reinflated and used the Endoclose to place a horizontal mattress suture of #1 Novafil on the right side.  The mesh lay nicely against the anterior abdominal wall.  The abdomen was inspected as the pneumo was released.  The ports and closure were infiltrated with Exparel and closed with 4-0 Vicryl and Dermabond.    The patient tolerated the procedure well and was taken to the PACU in stable condition.  Foley removed and patient will be discharged to the care of his wife.     Matt B. Daphine DeutscherMartin, MD, [facility], [place] [phone]

## 2015-10-30 NOTE — H&P (View-Only) (Signed)
Chief Complaint:  Incarcerated and increasing in size umbilical hernia  History of Present Illness:  Thomas Church is an 59 y.o. male who presented to the office last week with an increasing in size and incarcerated umbilical hernia.  He desires repair and this has been explained to him.   Past Medical History  Diagnosis Date  . Chest pain     Exercise echo in 2002 was normal per patient's report. ETT-myoview (2/11):8'35", no chest pain, no ischemic ECG changes, perfusion images showed no ischemia or infarction, per pt bad muscle cramp  . Hypercholesteremia   . Esophageal reflux     controlled on OTC omeprazole  . Erectile dysfunction   . Barrett's esophagus     ?  Marland Kitchen Wears glasses   . Lazy eye     right eye   . Dizziness     Past Surgical History  Procedure Laterality Date  . Hand surgery      Right, due to olaceration thenar eminence  . Esophagogastroduodenoscopy  01/08/1997    stricture stretched; GERD  . Exercise echo  03/09/2001    normal  . Femoral hernia repair  01/06/2005    left incarcerated (Dr. Hassell Done)  . Ett  12/2009    no chest pain, no ischemic ECG changes, perfusion images showed no ischemia or infarction, per pt bad muscle cramp    Current Outpatient Prescriptions  Medication Sig Dispense Refill  . aspirin 81 MG EC tablet Take 81 mg by mouth at bedtime. Swallow whole.    . ibuprofen (ADVIL,MOTRIN) 200 MG tablet Take 400-800 mg by mouth every 6 (six) hours as needed for headache or mild pain.    Marland Kitchen omeprazole (PRILOSEC OTC) 20 MG tablet Take 20 mg by mouth as directed. Takes every morning and at night if needed for indigestion    . sildenafil (REVATIO) 20 MG tablet Take 2-5 tablets (40-100 mg total) by mouth daily as needed. (Patient taking differently: Take 60-80 mg by mouth daily as needed (erectile dysfunction). ) 50 tablet 0  . vitamin B-12 (CYANOCOBALAMIN) 1000 MCG tablet Take 2,000 mcg by mouth daily.      No current facility-administered medications for  this visit.   Review of patient's allergies indicates no known allergies. Family History  Problem Relation Age of Onset  . Other Mother     Giant cell arteritis  . Heart disease Father 35    Mi; CABG x 2, AAA varices  . HIV Father     2nd to 1st CABG  . Hypertension Father   . Cancer Daughter     leukemia  . Stroke Other     remote  . Diabetes Paternal Uncle    Social History:   reports that he quit smoking about 14 years ago. His smoking use included Cigarettes, Pipe, and Cigars. He has a 30 pack-year smoking history. He has never used smokeless tobacco. He reports that he does not drink alcohol or use illicit drugs.   REVIEW OF SYSTEMS : Negative except for see problem list  Physical Exam:   There were no vitals taken for this visit. There is no weight on file to calculate BMI.  Gen:  WDWN WM NAD  Neurological: Alert and oriented to person, place, and time. Motor and sensory function is grossly intact  Head: Normocephalic and atraumatic.  Eyes: Conjunctivae are normal. Pupils are equal, round, and reactive to light. No scleral icterus.  Neck: Normal range of motion. Neck supple. No tracheal deviation or  thyromegaly present.  Cardiovascular:  SR without murmurs or gallops.  No carotid bruits Breast:  Not examined Respiratory: Effort normal.  No respiratory distress. No chest wall tenderness. Breath sounds normal.  No wheezes, rales or rhonchi.  Abdomen:  Large umbilica hernia that is unable to completely reduce and likely contains incarcerated fat GU:  Not examined Musculoskeletal: Normal range of motion. Extremities are nontender. No cyanosis, edema or clubbing noted Lymphadenopathy: No cervical, preauricular, postauricular or axillary adenopathy is present Skin: Skin is warm and dry. No rash noted. No diaphoresis. No erythema. No pallor. Pscyh: Normal mood and affect. Behavior is normal. Judgment and thought content normal.   LABORATORY RESULTS: Results for orders placed  or performed during the hospital encounter of 10/29/15 (from the past 48 hour(s))  CBC     Status: None   Collection Time: 10/29/15 10:00 AM  Result Value Ref Range   WBC 7.9 4.0 - 10.5 K/uL   RBC 4.87 4.22 - 5.81 MIL/uL   Hemoglobin 15.5 13.0 - 17.0 g/dL   HCT 44.3 39.0 - 52.0 %   MCV 91.0 78.0 - 100.0 fL   MCH 31.8 26.0 - 34.0 pg   MCHC 35.0 30.0 - 36.0 g/dL   RDW 12.6 11.5 - 15.5 %   Platelets 248 150 - 400 K/uL  Basic metabolic panel     Status: Abnormal   Collection Time: 10/29/15 10:00 AM  Result Value Ref Range   Sodium 142 135 - 145 mmol/L   Potassium 4.1 3.5 - 5.1 mmol/L   Chloride 106 101 - 111 mmol/L   CO2 27 22 - 32 mmol/L   Glucose, Bld 102 (H) 65 - 99 mg/dL   BUN 14 6 - 20 mg/dL   Creatinine, Ser 1.01 0.61 - 1.24 mg/dL   Calcium 9.5 8.9 - 10.3 mg/dL   GFR calc non Af Amer >60 >60 mL/min   GFR calc Af Amer >60 >60 mL/min    Comment: (NOTE) The eGFR has been calculated using the CKD EPI equation. This calculation has not been validated in all clinical situations. eGFR's persistently <60 mL/min signify possible Chronic Kidney Disease.    Anion gap 9 5 - 15     RADIOLOGY RESULTS: No results found.  Problem List: Patient Active Problem List   Diagnosis Date Noted  . Umbilical hernia without obstruction and without gangrene 02/12/2015  . Vitamin B12 deficiency 02/12/2015  . Skin rash 05/21/2013  . Family history of early CAD 10/18/2012  . Healthcare maintenance 05/15/2012  . Cutaneous skin tags 05/15/2012  . Onychomycosis 05/15/2012  . WEIGHT GAIN 12/10/2010  . GERD (gastroesophageal reflux disease) 12/04/2009  . HYPERCHOLESTEROLEMIA 11/24/2007  . ERECTILE DYSFUNCTION 11/24/2007    Assessment & Plan: Large umbilical hernia for lap assisted umbilical hernia repair.      Matt B. Hassell Done, MD, [facility] [phone] beeper [phone]  10/29/2015 11:04 AM

## 2016-01-02 ENCOUNTER — Other Ambulatory Visit: Payer: Self-pay | Admitting: Family Medicine

## 2016-01-20 ENCOUNTER — Telehealth: Payer: Self-pay | Admitting: Family Medicine

## 2016-01-20 MED ORDER — OSELTAMIVIR PHOSPHATE 75 MG PO CAPS: 75.0000 mg | ORAL_CAPSULE | Freq: Every day | ORAL | Status: DC

## 2016-01-20 NOTE — Telephone Encounter (Signed)
Patient Name: Thomas Church  DOB: 17-Dec-1955    Initial Comment RECORD 2/2: Caller states has been exposed to the flu on Sat. Asking for rx for Tamiflu   Nurse Assessment  Nurse: Annye Englisharmon, RN, Angelique Blonderenise Date/Time Lamount Cohen(Eastern Time): 01/20/2016 1:24:23 PM  Please select the assessment type ---Verbal order / New medication order  Additional Documentation ---RECORD 2/2: Pt's spouse states the pt has been exposed to the flu on Sat. Asking for rx for Tamiflu. Pt with the caller and he denies s/s at this time.  Does the client directives allow for assistance with medications after hours? ---No  Additional Documentation ---Advised will trans her to the [facility] for further asst with Rx for Tamiflu for her spouse. Verb understanding.     Guidelines    Guideline Title Affirmed Question Affirmed Notes       Final Disposition User   Clinical Call Hauulaarmon, RN, [employer] PartnersDenise

## 2016-01-20 NOTE — Telephone Encounter (Signed)
Tamiflu eRx sent.

## 2016-01-20 NOTE — Telephone Encounter (Signed)
Pt has been exposed to flu on Sat diagnosed as Type A. Asking for a rx for Tamiflu as they are the primary caregivers for mother-in-law having surgery this week.  [facility].  [phone]

## 2016-02-11 ENCOUNTER — Other Ambulatory Visit: Payer: Self-pay | Admitting: Family Medicine

## 2016-02-11 ENCOUNTER — Other Ambulatory Visit (INDEPENDENT_AMBULATORY_CARE_PROVIDER_SITE_OTHER): Payer: BLUE CROSS/BLUE SHIELD

## 2016-02-11 DIAGNOSIS — E538 Deficiency of other specified B group vitamins: Secondary | ICD-10-CM | POA: Diagnosis not present

## 2016-02-11 DIAGNOSIS — E7800 Pure hypercholesterolemia, unspecified: Secondary | ICD-10-CM

## 2016-02-11 DIAGNOSIS — Z125 Encounter for screening for malignant neoplasm of prostate: Secondary | ICD-10-CM

## 2016-02-11 DIAGNOSIS — Z1159 Encounter for screening for other viral diseases: Secondary | ICD-10-CM

## 2016-02-11 LAB — PSA: PSA: 0.31 ng/mL (ref 0.10–4.00)

## 2016-02-11 LAB — LIPID PANEL
CHOL/HDL RATIO: 6
Cholesterol: 296 mg/dL — ABNORMAL HIGH (ref 0–200)
HDL: 49 mg/dL (ref >39.00)
LDL Cholesterol: 218 mg/dL — ABNORMAL HIGH (ref 0–99)
NONHDL: 246.82
TRIGLYCERIDES: 145 mg/dL (ref 0.0–149.0)
VLDL: 29 mg/dL (ref 0.0–40.0)

## 2016-02-11 LAB — BASIC METABOLIC PANEL
BUN: 17 mg/dL (ref 6–23)
CALCIUM: 9.8 mg/dL (ref 8.4–10.5)
CHLORIDE: 103 meq/L (ref 96–112)
CO2: 28 mEq/L (ref 19–32)
CREATININE: 1.1 mg/dL (ref 0.40–1.50)
GFR: 72.91 mL/min (ref >60.00)
GLUCOSE: 101 mg/dL — AB (ref 70–99)
POTASSIUM: 4.8 meq/L (ref 3.5–5.1)
Sodium: 139 mEq/L (ref 135–145)

## 2016-02-11 LAB — VITAMIN B12: VITAMIN B 12: 1249 pg/mL — AB (ref 211–911)

## 2016-02-12 LAB — HEPATITIS C ANTIBODY: HCV AB: NEGATIVE

## 2016-02-16 ENCOUNTER — Ambulatory Visit (INDEPENDENT_AMBULATORY_CARE_PROVIDER_SITE_OTHER): Payer: BLUE CROSS/BLUE SHIELD | Admitting: Family Medicine

## 2016-02-16 ENCOUNTER — Encounter: Payer: Self-pay | Admitting: Family Medicine

## 2016-02-16 VITALS — BP 104/84 | HR 73 | Temp 97.9°F | Ht 69.0 in | Wt 216.0 lb

## 2016-02-16 DIAGNOSIS — Z0000 Encounter for general adult medical examination without abnormal findings: Secondary | ICD-10-CM

## 2016-02-16 DIAGNOSIS — K219 Gastro-esophageal reflux disease without esophagitis: Secondary | ICD-10-CM

## 2016-02-16 DIAGNOSIS — E7800 Pure hypercholesterolemia, unspecified: Secondary | ICD-10-CM

## 2016-02-16 DIAGNOSIS — E538 Deficiency of other specified B group vitamins: Secondary | ICD-10-CM

## 2016-02-16 DIAGNOSIS — G43109 Migraine with aura, not intractable, without status migrainosus: Secondary | ICD-10-CM

## 2016-02-16 DIAGNOSIS — Z1211 Encounter for screening for malignant neoplasm of colon: Secondary | ICD-10-CM

## 2016-02-16 DIAGNOSIS — Z8249 Family history of ischemic heart disease and other diseases of the circulatory system: Secondary | ICD-10-CM

## 2016-02-16 MED ORDER — CO Q 10 100 MG PO CAPS: 1.0000 | ORAL_CAPSULE | Freq: Every day | ORAL | Status: AC

## 2016-02-16 MED ORDER — EZETIMIBE-SIMVASTATIN 10-40 MG PO TABS: 1.0000 | ORAL_TABLET | Freq: Every day | ORAL | Status: DC

## 2016-02-16 NOTE — Assessment & Plan Note (Signed)
Preventative protocols reviewed and updated unless pt declined. Discussed healthy diet and lifestyle.  

## 2016-02-16 NOTE — Assessment & Plan Note (Signed)
Usually episodes occur in spring then no more entire year

## 2016-02-16 NOTE — Assessment & Plan Note (Addendum)
Chronic, deteriorated off vytorin and RYR.  Agrees to restart vytorin

## 2016-02-16 NOTE — Assessment & Plan Note (Signed)
Compliant with aspirin. Agrees to restart vytorin.

## 2016-02-16 NOTE — Patient Instructions (Addendum)
Pass by lab to pick up stool kit.  Restart vytorin 10/40mg nightly. google "vytorin coupon" Good to see you today, call us with questions. Return as needed or in 1 year for next physical. Return in 4 months for labs only.   Health Maintenance, Male A healthy lifestyle and preventative care can promote health and wellness.  Maintain regular health, dental, and eye exams.  Eat a healthy diet. Foods like vegetables, fruits, whole grains, low-fat dairy products, and lean protein foods contain the nutrients you need and are low in calories. Decrease your intake of foods high in solid fats, added sugars, and salt. Get information about a proper diet from your health care provider, if necessary.  Regular physical exercise is one of the most important things you can do for your health. Most adults should get at least 150 minutes of moderate-intensity exercise (any activity that increases your heart rate and causes you to sweat) each week. In addition, most adults need muscle-strengthening exercises on 2 or more days a week.   Maintain a healthy weight. The body mass index (BMI) is a screening tool to identify possible weight problems. It provides an estimate of body fat based on height and weight. Your health care provider can find your BMI and can help you achieve or maintain a healthy weight. For males 20 years and older:  A BMI below 18.5 is considered underweight.  A BMI of 18.5 to 24.9 is normal.  A BMI of 25 to 29.9 is considered overweight.  A BMI of 30 and above is considered obese.  Maintain normal blood lipids and cholesterol by exercising and minimizing your intake of saturated fat. Eat a balanced diet with plenty of fruits and vegetables. Blood tests for lipids and cholesterol should begin at age 15 and be repeated every 5 years. If your lipid or cholesterol levels are high, you are over age 17, or you are at high risk for heart disease, you may need your cholesterol levels checked more  frequently.Ongoing high lipid and cholesterol levels should be treated with medicines if diet and exercise are not working.  If you smoke, find out from your health care provider how to quit. If you do not use tobacco, do not start.  Lung cancer screening is recommended for adults aged 63-80 years who are at high risk for developing lung cancer because of a history of smoking. A yearly low-dose CT scan of the lungs is recommended for people who have at least a 30-pack-year history of smoking and are current smokers or have quit within the past 15 years. A pack year of smoking is smoking an average of 1 pack of cigarettes a day for 1 year (for example, a 30-pack-year history of smoking could mean smoking 1 pack a day for 30 years or 2 packs a day for 15 years). Yearly screening should continue until the smoker has stopped smoking for at least 15 years. Yearly screening should be stopped for people who develop a health problem that would prevent them from having lung cancer treatment.  If you choose to drink alcohol, do not have more than 2 drinks per day. One drink is considered to be 12 oz (360 mL) of beer, 5 oz (150 mL) of wine, or 1.5 oz (45 mL) of liquor.  Avoid the use of street drugs. Do not share needles with anyone. Ask for help if you need support or instructions about stopping the use of drugs.  High blood pressure causes heart disease and  increases the risk of stroke. High blood pressure is more likely to develop in:  People who have blood pressure in the end of the normal range (100-139/85-89 mm Hg).  People who are overweight or obese.  People who are African American.  If you are 45-58 years of age, have your blood pressure checked every 3-5 years. If you are 73 years of age or older, have your blood pressure checked every year. You should have your blood pressure measured twice--once when you are at a hospital or clinic, and once when you are not at a hospital or clinic. Record the  average of the two measurements. To check your blood pressure when you are not at a hospital or clinic, you can use:  An automated blood pressure machine at a pharmacy.  A home blood pressure monitor.  If you are 65-34 years old, ask your health care provider if you should take aspirin to prevent heart disease.  Diabetes screening involves taking a blood sample to check your fasting blood sugar level. This should be done once every 3 years after age 71 if you are at a normal weight and without risk factors for diabetes. Testing should be considered at a younger age or be carried out more frequently if you are overweight and have at least 1 risk factor for diabetes.  Colorectal cancer can be detected and often prevented. Most routine colorectal cancer screening begins at the age of 24 and continues through age 38. However, your health care provider may recommend screening at an earlier age if you have risk factors for colon cancer. On a yearly basis, your health care provider may provide home test kits to check for hidden blood in the stool. A small camera at the end of a tube may be used to directly examine the colon (sigmoidoscopy or colonoscopy) to detect the earliest forms of colorectal cancer. Talk to your health care provider about this at age 33 when routine screening begins. A direct exam of the colon should be repeated every 5-10 years through age 40, unless early forms of precancerous polyps or small growths are found.  People who are at an increased risk for hepatitis B should be screened for this virus. You are considered at high risk for hepatitis B if:  You were born in a country where hepatitis B occurs often. Talk with your health care provider about which countries are considered high risk.  Your parents were born in a high-risk country and you have not received a shot to protect against hepatitis B (hepatitis B vaccine).  You have HIV or AIDS.  You use needles to inject street  drugs.  You live with, or have sex with, someone who has hepatitis B.  You are a man who has sex with other men (MSM).  You get hemodialysis treatment.  You take certain medicines for conditions like cancer, organ transplantation, and autoimmune conditions.  Hepatitis C blood testing is recommended for all people born from [birth year] through 1964 and any individual with known risk factors for hepatitis C.  Healthy men should no longer receive prostate-specific antigen (PSA) blood tests as part of routine cancer screening. Talk to your health care provider about prostate cancer screening.  Testicular cancer screening is not recommended for adolescents or adult males who have no symptoms. Screening includes self-exam, a health care provider exam, and other screening tests. Consult with your health care provider about any symptoms you have or any concerns you have about testicular cancer.  Practice safe sex. Use condoms and avoid high-risk sexual practices to reduce the spread of sexually transmitted infections (STIs).  You should be screened for STIs, including gonorrhea and chlamydia if:  You are sexually active and are younger than 24 years.  You are older than 24 years, and your health care provider tells you that you are at risk for this type of infection.  Your sexual activity has changed since you were last screened, and you are at an increased risk for chlamydia or gonorrhea. Ask your health care provider if you are at risk.  If you are at risk of being infected with HIV, it is recommended that you take a prescription medicine daily to prevent HIV infection. This is called pre-exposure prophylaxis (PrEP). You are considered at risk if:  You are a man who has sex with other men (MSM).  You are a heterosexual man who is sexually active with multiple partners.  You take drugs by injection.  You are sexually active with a partner who has HIV.  Talk with your health care provider about  whether you are at high risk of being infected with HIV. If you choose to begin PrEP, you should first be tested for HIV. You should then be tested every 3 months for as long as you are taking PrEP.  Use sunscreen. Apply sunscreen liberally and repeatedly throughout the day. You should seek shade when your shadow is shorter than you. Protect yourself by wearing long sleeves, pants, a wide-brimmed hat, and sunglasses year round whenever you are outdoors.  Tell your health care provider of new moles or changes in moles, especially if there is a change in shape or color. Also, tell your health care provider if a mole is larger than the size of a pencil eraser.  A one-time screening for abdominal aortic aneurysm (AAA) and surgical repair of large AAAs by ultrasound is recommended for men aged 13-75 years who are current or former smokers.  Stay current with your vaccines (immunizations).   This information is not intended to replace advice given to you by your health care provider. Make sure you discuss any questions you have with your health care provider.   Document Released: 04/15/2008 Document Revised: 11/08/2014 Document Reviewed: 03/15/2011 Elsevier Interactive Patient Education [employer].

## 2016-02-16 NOTE — Progress Notes (Signed)
BP 104/84 mmHg  Pulse 73  Temp(Src) 97.9 F (36.6 C) (Oral)  Ht  (1.753 m)  Wt 216 lb (97.977 kg)  BMI 31.88 kg/m2  SpO2 94%   CC: CPE  Subjective:    Patient ID: Thomas Church, male    DOB: 06/04/1956, 60 y.o.   MRN: [id number]  HPI: Thomas Church is a 60 y.o. male presenting on 02/16/2016 for Annual Exam   Incarcerated umbilical hernia repair 10/2015.   HLD - stopped vytorin 1 yr ago.   GERD ?Barrett's last EGD 1998 - compliant with omeprazole  daily. Occasionally twice daily. Declines rpt at this time. Likes spicy foods.   Preventative: Colon screening - would like to continue iFOB. Normal in past. Forgot to turn in last year. Prostate screening - discussed. would like yearly check.  Flu 12/2015 Tdap 2013 Seat belt use discussed Sunscreen use discussed. No changing moles on skin.   Caffeine: 2 cups coffee/day, some sodas/tea  Married//Remarried - 2 children - one by first wife/ 1 son  Occupation: Glass blower/designer Activity: started going to gym  Diet: increasing water, fruits/vegetables daily  Relevant past medical, surgical, family and social history reviewed and updated as indicated. Interim medical history since our last visit reviewed. Allergies and medications reviewed and updated. Current Outpatient Prescriptions on File Prior to Visit  Medication Sig  . aspirin 81 MG EC tablet Take 81 mg by mouth at bedtime. Swallow whole.  . ibuprofen (ADVIL,MOTRIN) 200 MG tablet Take 400-800 mg by mouth every 6 (six) hours as needed for headache or mild pain.  Marland Kitchen omeprazole (PRILOSEC OTC) 20 MG tablet Take 20 mg by mouth as directed. Takes every morning and at night if needed for indigestion  . sildenafil (REVATIO) 20 MG tablet TAKE 2-5 TABLETS BY MOUTH ONCE A DAY AS NEEDED  . vitamin B-12 (CYANOCOBALAMIN) 1000 MCG tablet Take 1,000 mcg by mouth daily.    No current facility-administered medications on file prior to visit.    Review of Systems  Constitutional:  Negative for fever, chills, activity change, appetite change, fatigue and unexpected weight change.  HENT: Negative for hearing loss.   Eyes: Positive for visual disturbance (mgraine aura without headache).  Respiratory: Positive for shortness of breath (with breathing). Negative for cough, chest tightness and wheezing.   Cardiovascular: Negative for chest pain, palpitations and leg swelling.  Gastrointestinal: Negative for nausea, vomiting, abdominal pain, diarrhea, constipation, blood in stool and abdominal distention.  Genitourinary: Negative for hematuria and difficulty urinating.  Musculoskeletal: Negative for myalgias, arthralgias and neck pain.  Skin: Negative for rash.  Neurological: Negative for dizziness, seizures, syncope and headaches.  Hematological: Negative for adenopathy. Does not bruise/bleed easily.  Psychiatric/Behavioral: Negative for dysphoric mood. The patient is not nervous/anxious.    Per HPI unless specifically indicated in ROS section     Objective:    BP 104/84 mmHg  Pulse 73  Temp(Src) 97.9 F (36.6 C) (Oral)  Ht  (1.753 m)  Wt 216 lb (97.977 kg)  BMI 31.88 kg/m2  SpO2 94%  Wt Readings from Last 3 Encounters:  02/16/16 216 lb (97.977 kg)  10/30/15 218 lb 8 oz (99.111 kg)  10/29/15 218 lb 8 oz (99.111 kg)    Physical Exam  Constitutional: He is oriented to person, place, and time. He appears well-developed and well-nourished. No distress.  HENT:  Head: Normocephalic and atraumatic.  Right Ear: Hearing, tympanic membrane, external ear and ear canal normal.  Left Ear: Hearing, tympanic  membrane, external ear and ear canal normal.  Nose: Nose normal.  Mouth/Throat: Uvula is midline, oropharynx is clear and moist and mucous membranes are normal. No oropharyngeal exudate, posterior oropharyngeal edema or posterior oropharyngeal erythema.  Eyes: Conjunctivae and EOM are normal. Pupils are equal, round, and reactive to light. No scleral icterus.  Neck:  Normal range of motion. Neck supple. Carotid bruit is not present. No thyromegaly present.  Cardiovascular: Normal rate, regular rhythm, normal heart sounds and intact distal pulses.   No murmur heard. Pulses:      Radial pulses are 2+ on the right side, and 2+ on the left side.  Pulmonary/Chest: Effort normal and breath sounds normal. No respiratory distress. He has no wheezes. He has no rales.  Abdominal: Soft. Bowel sounds are normal. He exhibits no distension and no mass. There is no tenderness. There is no rebound and no guarding.  Genitourinary: Rectum normal and prostate normal. Rectal exam shows no external hemorrhoid, no internal hemorrhoid, no fissure, no mass, no tenderness and anal tone normal. Prostate is not enlarged (20gm) and not tender.  Musculoskeletal: Normal range of motion. He exhibits no edema.  Lymphadenopathy:    He has no cervical adenopathy.  Neurological: He is alert and oriented to person, place, and time.  CN grossly intact, station and gait intact  Skin: Skin is warm and dry. No rash noted.  Psychiatric: He has a normal mood and affect. His behavior is normal. Judgment and thought content normal.  Nursing note and vitals reviewed.  Results for orders placed or performed in visit on 02/11/16  Lipid panel  Result Value Ref Range   Cholesterol 296 (H) 0 - 200 mg/dL   Triglycerides 161.0145.0 0.0 - 149.0 mg/dL   HDL 96.0449.00 >54.09>39.00 mg/dL   VLDL 81.129.0 0.0 - 91.440.0 mg/dL   LDL Cholesterol 782218 (H) 0 - 99 mg/dL   Total CHOL/HDL Ratio 6    NonHDL 246.82   PSA  Result Value Ref Range   PSA 0.31 0.10 - 4.00 ng/mL  Basic metabolic panel  Result Value Ref Range   Sodium 139 135 - 145 mEq/L   Potassium 4.8 3.5 - 5.1 mEq/L   Chloride 103 96 - 112 mEq/L   CO2 28 19 - 32 mEq/L   Glucose, Bld 101 (H) 70 - 99 mg/dL   BUN 17 6 - 23 mg/dL   Creatinine, Ser 9.561.10 0.40 - 1.50 mg/dL   Calcium 9.8 8.4 - 21.310.5 mg/dL   GFR 08.6572.91 >78.46>60.00 mL/min  Vitamin B12  Result Value Ref Range    Vitamin B-12 1249 (H) 211 - 911 pg/mL      Assessment & Plan:   Problem List Items Addressed This Visit    HYPERCHOLESTEROLEMIA    Chronic, deteriorated off vytorin and RYR.  Agrees to restart vytorin      Relevant Medications   ezetimibe-simvastatin (VYTORIN) 10-40 MG tablet   GERD (gastroesophageal reflux disease)    Stable on PPI daily. ?Barrett's remotely (1998) but no current symptoms.       Healthcare maintenance - Primary    Preventative protocols reviewed and updated unless pt declined. Discussed healthy diet and lifestyle.       Family history of early CAD    Compliant with aspirin. Agrees to restart vytorin.      Vitamin B12 deficiency    Levels high - decrease vit B12 to 1000 mcg daily.      Migraine aura without headache    Usually episodes occur  in spring then no more entire year      Relevant Medications   ezetimibe-simvastatin (VYTORIN) 10-40 MG tablet    Other Visit Diagnoses    Special screening for malignant neoplasms, colon        Relevant Orders    Fecal occult blood, imunochemical       Follow up plan: Return in about 1 year (around 02/15/2017), or as needed, for annual exam, prior fasting for blood work.  Eustaquio Boyden, MD

## 2016-02-16 NOTE — Progress Notes (Signed)
Pre visit review using our clinic review tool, if applicable. No additional management support is needed unless otherwise documented below in the visit note. 

## 2016-02-16 NOTE — Assessment & Plan Note (Addendum)
Levels high - decrease vit B12 to 1000 mcg daily.

## 2016-02-16 NOTE — Assessment & Plan Note (Signed)
Stable on PPI daily. ?Barrett's remotely (1998) but no current symptoms.

## 2016-02-17 ENCOUNTER — Telehealth: Payer: Self-pay | Admitting: *Deleted

## 2016-02-17 NOTE — Telephone Encounter (Signed)
Vytorin required PA. Completed through Cover My Meds and approved. Pharmacy notified.

## 2016-03-10 ENCOUNTER — Telehealth: Payer: Self-pay | Admitting: Family Medicine

## 2016-03-10 NOTE — Telephone Encounter (Signed)
In your IN box for completion.  

## 2016-03-10 NOTE — Telephone Encounter (Signed)
Spouse dropped off paperwork from [employer] On kim's desk

## 2016-03-11 NOTE — Telephone Encounter (Signed)
Form filled and in kim's box.

## 2016-03-11 NOTE — Telephone Encounter (Signed)
Form mailed to [employer] and copy mailed to patient.

## 2016-06-14 ENCOUNTER — Encounter: Payer: Self-pay | Admitting: Internal Medicine

## 2016-06-14 ENCOUNTER — Ambulatory Visit (INDEPENDENT_AMBULATORY_CARE_PROVIDER_SITE_OTHER): Payer: Self-pay | Admitting: Internal Medicine

## 2016-06-14 NOTE — Patient Instructions (Signed)
Please try some heat on your neck and shoulders. You can continue the ibuprofen--- start at 400mg  but can take 600mg  ---- up to three times a day.

## 2016-06-14 NOTE — Assessment & Plan Note (Signed)
Soreness in shoulders and neck No chest symptoms of concern Not clear if he could have minor concussion--but no worrisome symptoms and neuro exam is normal Suggested he keep up with the ibuprofen ~400-800 tid prn Reassured--no evidence of major injury
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
## 2016-06-14 NOTE — Progress Notes (Signed)
Pre visit review using our clinic review tool, if applicable. No additional management support is needed unless otherwise documented below in the visit note. 

## 2016-06-16 ENCOUNTER — Other Ambulatory Visit: Payer: Self-pay | Admitting: Family Medicine

## 2016-06-16 DIAGNOSIS — E7800 Pure hypercholesterolemia, unspecified: Secondary | ICD-10-CM

## 2016-06-17 ENCOUNTER — Other Ambulatory Visit (INDEPENDENT_AMBULATORY_CARE_PROVIDER_SITE_OTHER): Payer: BLUE CROSS/BLUE SHIELD

## 2016-06-17 DIAGNOSIS — E7800 Pure hypercholesterolemia, unspecified: Secondary | ICD-10-CM | POA: Diagnosis not present

## 2016-06-17 LAB — LIPID PANEL
CHOLESTEROL: 152 mg/dL (ref 0–200)
HDL: 48.7 mg/dL (ref >39.00)
LDL CALC: 79 mg/dL (ref 0–99)
NonHDL: 103.22
TRIGLYCERIDES: 120 mg/dL (ref 0.0–149.0)
Total CHOL/HDL Ratio: 3
VLDL: 24 mg/dL (ref 0.0–40.0)

## 2016-06-18 ENCOUNTER — Encounter: Payer: Self-pay | Admitting: *Deleted

## 2016-06-18 ENCOUNTER — Other Ambulatory Visit: Payer: BLUE CROSS/BLUE SHIELD

## 2016-06-18 DIAGNOSIS — Z1211 Encounter for screening for malignant neoplasm of colon: Secondary | ICD-10-CM

## 2016-06-18 LAB — FECAL OCCULT BLOOD, GUAIAC: Fecal Occult Blood: NEGATIVE

## 2016-06-18 LAB — FECAL OCCULT BLOOD, IMMUNOCHEMICAL: FECAL OCCULT BLD: NEGATIVE

## 2016-11-24 ENCOUNTER — Ambulatory Visit: Payer: BLUE CROSS/BLUE SHIELD | Admitting: Internal Medicine

## 2016-11-24 ENCOUNTER — Encounter: Payer: Self-pay | Admitting: Family Medicine

## 2016-11-24 ENCOUNTER — Ambulatory Visit (INDEPENDENT_AMBULATORY_CARE_PROVIDER_SITE_OTHER): Payer: BLUE CROSS/BLUE SHIELD | Admitting: Family Medicine

## 2016-11-24 VITALS — BP 110/78 | HR 76 | Temp 97.7°F | Ht 69.0 in | Wt 225.8 lb

## 2016-11-24 DIAGNOSIS — K122 Cellulitis and abscess of mouth: Secondary | ICD-10-CM

## 2016-11-24 DIAGNOSIS — J029 Acute pharyngitis, unspecified: Secondary | ICD-10-CM

## 2016-11-24 LAB — POCT RAPID STREP A (OFFICE): Rapid Strep A Screen: NEGATIVE

## 2016-11-24 MED ORDER — AMOXICILLIN 500 MG PO CAPS
1000.0000 mg | ORAL_CAPSULE | Freq: Two times a day (BID) | ORAL | 0 refills | Status: AC
Start: 2016-11-24 — End: 2016-12-15
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
## 2016-11-24 NOTE — Progress Notes (Signed)
Pre visit review using our clinic review tool, if applicable. No additional management support is needed unless otherwise documented below in the visit note. 

## 2016-12-10 ENCOUNTER — Ambulatory Visit (INDEPENDENT_AMBULATORY_CARE_PROVIDER_SITE_OTHER): Payer: BLUE CROSS/BLUE SHIELD

## 2016-12-10 ENCOUNTER — Telehealth: Payer: Self-pay

## 2016-12-10 DIAGNOSIS — Z23 Encounter for immunization: Secondary | ICD-10-CM

## 2016-12-10 MED ORDER — OSELTAMIVIR PHOSPHATE 75 MG PO CAPS: 75.0000 mg | ORAL_CAPSULE | Freq: Every day | ORAL | 0 refills | Status: DC

## 2016-12-10 NOTE — Telephone Encounter (Signed)
Patient's wife made aware R/X at pharmacy.

## 2016-12-10 NOTE — Telephone Encounter (Signed)
Patient's wife calls to report that their 61 year old son has been diagnosed with Type A Influenza today.  Patient is not symptomatic but wants to know the following:  1.  Should he still come at 11:15am today for flu shot?  YES, this Clinical research associatewriter informs patient's wife.  2.  Can patient have r/x as preventative of Tamiflu called in to pharmacy [facility]

## 2016-12-10 NOTE — Telephone Encounter (Signed)
tamiflu preventative sent to pharmacy.

## 2017-02-15 ENCOUNTER — Other Ambulatory Visit: Payer: Self-pay | Admitting: Family Medicine

## 2017-02-15 ENCOUNTER — Other Ambulatory Visit (INDEPENDENT_AMBULATORY_CARE_PROVIDER_SITE_OTHER): Payer: BLUE CROSS/BLUE SHIELD

## 2017-02-15 DIAGNOSIS — E7800 Pure hypercholesterolemia, unspecified: Secondary | ICD-10-CM

## 2017-02-15 DIAGNOSIS — Z125 Encounter for screening for malignant neoplasm of prostate: Secondary | ICD-10-CM | POA: Diagnosis not present

## 2017-02-15 LAB — COMPREHENSIVE METABOLIC PANEL
ALT: 25 U/L (ref 0–53)
AST: 20 U/L (ref 0–37)
Albumin: 4.4 g/dL (ref 3.5–5.2)
Alkaline Phosphatase: 49 U/L (ref 39–117)
BILIRUBIN TOTAL: 0.5 mg/dL (ref 0.2–1.2)
BUN: 14 mg/dL (ref 6–23)
CALCIUM: 9.2 mg/dL (ref 8.4–10.5)
CO2: 29 meq/L (ref 19–32)
Chloride: 104 mEq/L (ref 96–112)
Creatinine, Ser: 1.05 mg/dL (ref 0.40–1.50)
GFR: 76.67 mL/min (ref >60.00)
GLUCOSE: 107 mg/dL — AB (ref 70–99)
POTASSIUM: 4.6 meq/L (ref 3.5–5.1)
Sodium: 139 mEq/L (ref 135–145)
Total Protein: 6.8 g/dL (ref 6.0–8.3)

## 2017-02-15 LAB — TSH: TSH: 1.92 u[IU]/mL (ref 0.35–4.50)

## 2017-02-15 LAB — PSA: PSA: 0.34 ng/mL (ref 0.10–4.00)

## 2017-02-15 LAB — LIPID PANEL
CHOL/HDL RATIO: 3
Cholesterol: 127 mg/dL (ref 0–200)
HDL: 43.3 mg/dL (ref >39.00)
LDL CALC: 71 mg/dL (ref 0–99)
NONHDL: 83.65
TRIGLYCERIDES: 64 mg/dL (ref 0.0–149.0)
VLDL: 12.8 mg/dL (ref 0.0–40.0)

## 2017-02-17 ENCOUNTER — Ambulatory Visit (INDEPENDENT_AMBULATORY_CARE_PROVIDER_SITE_OTHER): Payer: BLUE CROSS/BLUE SHIELD | Admitting: Family Medicine

## 2017-02-17 ENCOUNTER — Encounter: Payer: Self-pay | Admitting: Family Medicine

## 2017-02-17 VITALS — BP 114/68 | HR 70 | Temp 98.0°F | Ht 69.0 in | Wt 219.0 lb

## 2017-02-17 DIAGNOSIS — E538 Deficiency of other specified B group vitamins: Secondary | ICD-10-CM

## 2017-02-17 DIAGNOSIS — Z0001 Encounter for general adult medical examination with abnormal findings: Secondary | ICD-10-CM

## 2017-02-17 DIAGNOSIS — R0609 Other forms of dyspnea: Secondary | ICD-10-CM

## 2017-02-17 DIAGNOSIS — M79672 Pain in left foot: Secondary | ICD-10-CM | POA: Diagnosis not present

## 2017-02-17 DIAGNOSIS — E669 Obesity, unspecified: Secondary | ICD-10-CM

## 2017-02-17 DIAGNOSIS — K219 Gastro-esophageal reflux disease without esophagitis: Secondary | ICD-10-CM | POA: Diagnosis not present

## 2017-02-17 DIAGNOSIS — E7800 Pure hypercholesterolemia, unspecified: Secondary | ICD-10-CM

## 2017-02-17 DIAGNOSIS — Z8249 Family history of ischemic heart disease and other diseases of the circulatory system: Secondary | ICD-10-CM

## 2017-02-17 DIAGNOSIS — E66811 Obesity, class 1: Secondary | ICD-10-CM

## 2017-02-17 DIAGNOSIS — R0600 Dyspnea, unspecified: Secondary | ICD-10-CM

## 2017-02-17 MED ORDER — EZETIMIBE-SIMVASTATIN 10-40 MG PO TABS: 1.0000 | ORAL_TABLET | Freq: Every day | ORAL | 3 refills | Status: DC

## 2017-02-17 MED ORDER — SILDENAFIL CITRATE 20 MG PO TABS: ORAL_TABLET | ORAL | 3 refills | Status: AC

## 2017-02-17 MED ORDER — EZETIMIBE-SIMVASTATIN 10-40 MG PO TABS
1.0000 | ORAL_TABLET | Freq: Every day | ORAL | 3 refills | Status: AC
Start: 2017-02-17 — End: ?

## 2017-02-17 NOTE — Patient Instructions (Addendum)
EKG today.  I recommend start walking routine - goal is 150 min/wk of moderate intensity aerobic exercise.  For left foot pain - likely plantar fasciitis. Do stretching exercises provided today, frozen water bottle massage, heel insert.  Return as needed or in 1 year for next physical.  Health Maintenance, Male A healthy lifestyle and preventive care is important for your health and wellness. Ask your health care provider about what schedule of regular examinations is right for you. What should I know about weight and diet?  Eat a Healthy Diet  Eat plenty of vegetables, fruits, whole grains, low-fat dairy products, and lean protein.  Do not eat a lot of foods high in solid fats, added sugars, or salt. Maintain a Healthy Weight  Regular exercise can help you achieve or maintain a healthy weight. You should:  Do at least 150 minutes of exercise each week. The exercise should increase your heart rate and make you sweat (moderate-intensity exercise).  Do strength-training exercises at least twice a week. Watch Your Levels of Cholesterol and Blood Lipids  Have your blood tested for lipids and cholesterol every 5 years starting at 60 years of age. If you are at high risk for heart disease, you should start having your blood tested when you are 60 years old. You may need to have your cholesterol levels checked more often if:  Your lipid or cholesterol levels are high.  You are older than 60 years of age.  You are at high risk for heart disease. What should I know about cancer screening? Many types of cancers can be detected early and may often be prevented. Lung Cancer  You should be screened every year for lung cancer if:  You are a current smoker who has smoked for at least 30 years.  You are a former smoker who has quit within the past 15 years.  Talk to your health care provider about your screening options, when you should start screening, and how often you should be  screened. Colorectal Cancer  Routine colorectal cancer screening usually begins at 60 years of age and should be repeated every 5-10 years until you are 60 years old. You may need to be screened more often if early forms of precancerous polyps or small growths are found. Your health care provider may recommend screening at an earlier age if you have risk factors for colon cancer.  Your health care provider may recommend using home test kits to check for hidden blood in the stool.  A small camera at the end of a tube can be used to examine your colon (sigmoidoscopy or colonoscopy). This checks for the earliest forms of colorectal cancer. Prostate and Testicular Cancer  Depending on your age and overall health, your health care provider may do certain tests to screen for prostate and testicular cancer.  Talk to your health care provider about any symptoms or concerns you have about testicular or prostate cancer. Skin Cancer  Check your skin from head to toe regularly.  Tell your health care provider about any new moles or changes in moles, especially if:  There is a change in a mole's size, shape, or color.  You have a mole that is larger than a pencil eraser.  Always use sunscreen. Apply sunscreen liberally and repeat throughout the day.  Protect yourself by wearing long sleeves, pants, a wide-brimmed hat, and sunglasses when outside. What should I know about heart disease, diabetes, and high blood pressure?  If you are 18-39 years  of age, have your blood pressure checked every 3-5 years. If you are 31 years of age or older, have your blood pressure checked every year. You should have your blood pressure measured twice-once when you are at a hospital or clinic, and once when you are not at a hospital or clinic. Record the average of the two measurements. To check your blood pressure when you are not at a hospital or clinic, you can use:  An automated blood pressure machine at a  pharmacy.  A home blood pressure monitor.  Talk to your health care provider about your target blood pressure.  If you are between 27-50 years old, ask your health care provider if you should take aspirin to prevent heart disease.  Have regular diabetes screenings by checking your fasting blood sugar level.  If you are at a normal weight and have a low risk for diabetes, have this test once every three years after the age of 50.  If you are overweight and have a high risk for diabetes, consider being tested at a younger age or more often.  A one-time screening for abdominal aortic aneurysm (AAA) by ultrasound is recommended for men aged 75-75 years who are current or former smokers. What should I know about preventing infection? Hepatitis B  If you have a higher risk for hepatitis B, you should be screened for this virus. Talk with your health care provider to find out if you are at risk for hepatitis B infection. Hepatitis C  Blood testing is recommended for:  Everyone born from [birth year] through 1964.  Anyone with known risk factors for hepatitis C. Sexually Transmitted Diseases (STDs)  You should be screened each year for STDs including gonorrhea and chlamydia if:  You are sexually active and are younger than 60 years of age.  You are older than 60 years of age and your health care provider tells you that you are at risk for this type of infection.  Your sexual activity has changed since you were last screened and you are at an increased risk for chlamydia or gonorrhea. Ask your health care provider if you are at risk.  Talk with your health care provider about whether you are at high risk of being infected with HIV. Your health care provider may recommend a prescription medicine to help prevent HIV infection. What else can I do?  Schedule regular health, dental, and eye exams.  Stay current with your vaccines (immunizations).  Do not use any tobacco products, such as  cigarettes, chewing tobacco, and e-cigarettes. If you need help quitting, ask your health care provider.  Limit alcohol intake to no more than 2 drinks per day. One drink equals 12 ounces of beer, 5 ounces of wine, or 1 ounces of hard liquor.  Do not use street drugs.  Do not share needles.  Ask your health care provider for help if you need support or information about quitting drugs.  Tell your health care provider if you often feel depressed.  Tell your health care provider if you have ever been abused or do not feel safe at home. This information is not intended to replace advice given to you by your health care provider. Make sure you discuss any questions you have with your health care provider. Document Released: 04/15/2008 Document Revised: 06/16/2016 Document Reviewed: 07/22/2015 Elsevier Interactive Patient Education  2017 [employer].

## 2017-02-17 NOTE — Addendum Note (Signed)
Addended by: Desmond Dike on: 02/17/2017 11:53 AM   Modules accepted: Orders

## 2017-02-17 NOTE — Assessment & Plan Note (Signed)
Continues omeprazole  QD to BID. Declines EGD.

## 2017-02-17 NOTE — Addendum Note (Signed)
Addended by: Eustaquio Boyden on: 02/17/2017 11:54 AM   Modules accepted: Orders

## 2017-02-17 NOTE — Assessment & Plan Note (Addendum)
Chronic, stable. continue current regimen.  vytorin assistance application form filled out today.

## 2017-02-17 NOTE — Assessment & Plan Note (Signed)
Anticipate plantar fasciitis vs heel spur - treat with stretching exercises, frozen water bottle massage, and heel insert. Update if persistent or worsening. Pt agrees with plan.

## 2017-02-17 NOTE — Assessment & Plan Note (Addendum)
Continues vit b12 1000 mcg supplementation daily.

## 2017-02-17 NOTE — Progress Notes (Addendum)
BP 114/68   Pulse 70   Temp 98 F (36.7 C) (Oral)   Ht  (1.753 m)   Wt 219 lb (99.3 kg)   SpO2 96%   BMI 32.34 kg/m    CC: CPE Subjective:    Patient ID: Thomas Church, male    DOB: 1956-02-15, 61 y.o.   MRN: [id number]  HPI: Thomas Church is a 61 y.o. male presenting on 02/17/2017 for Annual Exam and Foot Pain (L heel)   GERD ?barrett's last EGD 1998, declines repeat.  Noticing some dyspnea on exertion ie when going up 2 flights of stairs.  Dx ophthalmic migraines by eye doctor.  Constant burning pain of L heel present for last 6-8 months. Boots with heels help.  Preventative: Colon screening - would like to continue iFOB. Normal in past. Forgot to turn in last year.  Prostate screening - discussed. would like yearly check. Always normal in the past. Consider Q2 yr screen. Lung cancer screening - not eligible (quit >15 yrs ago) Flu yearly Tdap 2013 Seat belt use discussed Sunscreen use discussed. No changing moles on skin.  Ex smoker - quit 2002, h/o 30 PY hx.  Alcohol - none - quit 2004  Caffeine: 2 cups coffee/day, some sodas/tea  Married//Remarried - 2 children - one by first wife/ 1 son  Occupation: Glass blower/designer  Activity: no regular exercise  Diet: increasing water, fruits/vegetables daily  Relevant past medical, surgical, family and social history reviewed and updated as indicated. Interim medical history since our last visit reviewed. Allergies and medications reviewed and updated. Outpatient Medications Prior to Visit  Medication Sig Dispense Refill  . aspirin 81 MG EC tablet Take 81 mg by mouth at bedtime. Swallow whole.    . Coenzyme Q10 (CO Q 10) 100 MG CAPS Take 1 capsule by mouth daily.    Marland Kitchen ibuprofen (ADVIL,MOTRIN) 200 MG tablet Take 400-800 mg by mouth every 6 (six) hours as needed for headache or mild pain.    Marland Kitchen omeprazole (PRILOSEC OTC) 20 MG tablet Take 20 mg by mouth as directed. Takes every morning and at night if needed for indigestion      . vitamin B-12 (CYANOCOBALAMIN) 1000 MCG tablet Take 1,000 mcg by mouth daily.     Marland Kitchen ezetimibe-simvastatin (VYTORIN) 10-40 MG tablet Take 1 tablet by mouth at bedtime. 30 tablet 11  . oseltamivir (TAMIFLU) 75 MG capsule Take 1 capsule (75 mg total) by mouth daily. 10 capsule 0  . sildenafil (REVATIO) 20 MG tablet TAKE 2-5 TABLETS BY MOUTH ONCE A DAY AS NEEDED 50 tablet 1   No facility-administered medications prior to visit.      Per HPI unless specifically indicated in ROS section below Review of Systems  Constitutional: Negative for activity change, appetite change, chills, fatigue, fever and unexpected weight change.  HENT: Negative for hearing loss.   Eyes: Negative for visual disturbance.  Respiratory: Positive for shortness of breath (when singing, exertional). Negative for cough, chest tightness and wheezing.   Cardiovascular: Negative for chest pain, palpitations and leg swelling.  Gastrointestinal: Negative for abdominal distention, abdominal pain, blood in stool, constipation, diarrhea, nausea and vomiting.  Genitourinary: Negative for difficulty urinating and hematuria.  Musculoskeletal: Negative for arthralgias, myalgias and neck pain.  Skin: Negative for rash.  Neurological: Negative for dizziness, seizures, syncope and headaches.  Hematological: Negative for adenopathy. Does not bruise/bleed easily.  Psychiatric/Behavioral: Negative for dysphoric mood. The patient is not nervous/anxious.  Objective:    BP 114/68   Pulse 70   Temp 98 F (36.7 C) (Oral)   Ht  (1.753 m)   Wt 219 lb (99.3 kg)   SpO2 96%   BMI 32.34 kg/m   Wt Readings from Last 3 Encounters:  02/17/17 219 lb (99.3 kg)  11/24/16 225 lb 12 oz (102.4 kg)  06/14/16 222 lb (100.7 kg)    Physical Exam  Constitutional: He is oriented to person, place, and time. He appears well-developed and well-nourished. No distress.  HENT:  Head: Normocephalic and atraumatic.  Right Ear: Hearing,  tympanic membrane, external ear and ear canal normal.  Left Ear: Hearing, tympanic membrane, external ear and ear canal normal.  Nose: Nose normal.  Mouth/Throat: Uvula is midline, oropharynx is clear and moist and mucous membranes are normal. No oropharyngeal exudate, posterior oropharyngeal edema or posterior oropharyngeal erythema.  Eyes: Conjunctivae and EOM are normal. Pupils are equal, round, and reactive to light. No scleral icterus.  Neck: Normal range of motion. Neck supple. No thyromegaly present.  Cardiovascular: Normal rate, regular rhythm, normal heart sounds and intact distal pulses.   No murmur heard. Pulses:      Radial pulses are 2+ on the right side, and 2+ on the left side.  Pulmonary/Chest: Effort normal and breath sounds normal. No respiratory distress. He has no wheezes. He has no rales.  Abdominal: Soft. Bowel sounds are normal. He exhibits no distension and no mass. There is no tenderness. There is no rebound and no guarding.  Genitourinary: Rectum normal and prostate normal. Rectal exam shows no external hemorrhoid, no internal hemorrhoid, no fissure, no mass, no tenderness and anal tone normal. Prostate is not enlarged (20gm) and not tender.  Musculoskeletal: Normal range of motion. He exhibits no edema.  Lymphadenopathy:    He has no cervical adenopathy.  Neurological: He is alert and oriented to person, place, and time.  CN grossly intact, station and gait intact  Skin: Skin is warm and dry. No rash noted.  Psychiatric: He has a normal mood and affect. His behavior is normal. Judgment and thought content normal.  Nursing note and vitals reviewed.  Results for orders placed or performed in visit on 02/15/17  Lipid panel  Result Value Ref Range   Cholesterol 127 0 - 200 mg/dL   Triglycerides 16.1 0.0 - 149.0 mg/dL   HDL 09.60 >45.40 mg/dL   VLDL 98.1 0.0 - 19.1 mg/dL   LDL Cholesterol 71 0 - 99 mg/dL   Total CHOL/HDL Ratio 3    NonHDL 83.65   Comprehensive  metabolic panel  Result Value Ref Range   Sodium 139 135 - 145 mEq/L   Potassium 4.6 3.5 - 5.1 mEq/L   Chloride 104 96 - 112 mEq/L   CO2 29 19 - 32 mEq/L   Glucose, Bld 107 (H) 70 - 99 mg/dL   BUN 14 6 - 23 mg/dL   Creatinine, Ser 4.78 0.40 - 1.50 mg/dL   Total Bilirubin 0.5 0.2 - 1.2 mg/dL   Alkaline Phosphatase 49 39 - 117 U/L   AST 20 0 - 37 U/L   ALT 25 0 - 53 U/L   Total Protein 6.8 6.0 - 8.3 g/dL   Albumin 4.4 3.5 - 5.2 g/dL   Calcium 9.2 8.4 - 29.5 mg/dL   GFR 62.13 >08.65 mL/min  PSA  Result Value Ref Range   PSA 0.34 0.10 - 4.00 ng/mL  TSH  Result Value Ref Range   TSH  1.92 0.35 - 4.50 uIU/mL   EKG - NSR rate 60s, normal axis, intervals, no acute ST/T changes.     Assessment & Plan:   Problem List Items Addressed This Visit    Encounter for general adult medical examination with abnormal findings - Primary    Preventative protocols reviewed and updated unless pt declined. Discussed healthy diet and lifestyle.       Family history of early CAD    Continue aspirin and vytorin.  Update EKG today given endorsed new mild exertional dyspnea. Anticipate weight and deconditioning related. No chest pain.       GERD (gastroesophageal reflux disease)    Continues omeprazole  QD to BID. Declines EGD.      HYPERCHOLESTEROLEMIA    Chronic, stable. continue current regimen.  vytorin assistance application form filled out today.       Relevant Medications   sildenafil (REVATIO) 20 MG tablet   ezetimibe-simvastatin (VYTORIN) 10-40 MG tablet   Obesity, Class I, BMI 30.0-34.9 (see actual BMI)    Discussed healthy diet and lifestyle changes to affect sustainable weight loss.       Pain of left heel    Anticipate plantar fasciitis vs heel spur - treat with stretching exercises, frozen water bottle massage, and heel insert. Update if persistent or worsening. Pt agrees with plan.       Vitamin B12 deficiency    Continues vit b12 1000 mcg supplementation daily.        Other Visit Diagnoses    Exertional dyspnea           Follow up plan: Return in about 1 year (around 02/17/2018) for annual exam, prior fasting for blood work.  Eustaquio Boyden, MD

## 2017-02-17 NOTE — Assessment & Plan Note (Signed)
Continue aspirin and vytorin.  Update EKG today given endorsed new mild exertional dyspnea. Anticipate weight and deconditioning related. No chest pain.

## 2017-02-17 NOTE — Assessment & Plan Note (Signed)
Discussed healthy diet and lifestyle changes to affect sustainable weight loss  

## 2017-02-17 NOTE — Assessment & Plan Note (Signed)
Preventative protocols reviewed and updated unless pt declined. Discussed healthy diet and lifestyle.  

## 2017-02-17 NOTE — Progress Notes (Signed)
Pre visit review using our clinic review tool, if applicable. No additional management support is needed unless otherwise documented below in the visit note. 

## 2017-05-19 DIAGNOSIS — H43813 Vitreous degeneration, bilateral: Secondary | ICD-10-CM | POA: Diagnosis not present

## 2017-05-19 DIAGNOSIS — H2513 Age-related nuclear cataract, bilateral: Secondary | ICD-10-CM | POA: Diagnosis not present

## 2017-05-19 DIAGNOSIS — H5202 Hypermetropia, left eye: Secondary | ICD-10-CM | POA: Diagnosis not present

## 2017-08-02 DIAGNOSIS — M25561 Pain in right knee: Secondary | ICD-10-CM | POA: Diagnosis not present

## 2017-08-03 DIAGNOSIS — M25561 Pain in right knee: Secondary | ICD-10-CM | POA: Diagnosis not present

## 2017-08-03 DIAGNOSIS — R531 Weakness: Secondary | ICD-10-CM | POA: Diagnosis not present

## 2017-08-03 DIAGNOSIS — M94261 Chondromalacia, right knee: Secondary | ICD-10-CM | POA: Diagnosis not present

## 2017-08-16 DIAGNOSIS — M25561 Pain in right knee: Secondary | ICD-10-CM | POA: Diagnosis not present

## 2017-08-20 DIAGNOSIS — M25561 Pain in right knee: Secondary | ICD-10-CM | POA: Diagnosis not present

## 2017-08-22 DIAGNOSIS — M25561 Pain in right knee: Secondary | ICD-10-CM | POA: Diagnosis not present

## 2017-08-31 DIAGNOSIS — M13161 Monoarthritis, not elsewhere classified, right knee: Secondary | ICD-10-CM | POA: Diagnosis not present

## 2017-08-31 DIAGNOSIS — S83241A Other tear of medial meniscus, current injury, right knee, initial encounter: Secondary | ICD-10-CM | POA: Diagnosis not present

## 2017-08-31 DIAGNOSIS — M94261 Chondromalacia, right knee: Secondary | ICD-10-CM | POA: Diagnosis not present

## 2017-08-31 DIAGNOSIS — X58XXXA Exposure to other specified factors, initial encounter: Secondary | ICD-10-CM | POA: Diagnosis not present

## 2017-08-31 DIAGNOSIS — G8918 Other acute postprocedural pain: Secondary | ICD-10-CM | POA: Diagnosis not present

## 2017-08-31 DIAGNOSIS — Y999 Unspecified external cause status: Secondary | ICD-10-CM | POA: Diagnosis not present

## 2017-09-05 DIAGNOSIS — M94261 Chondromalacia, right knee: Secondary | ICD-10-CM | POA: Diagnosis not present

## 2017-09-05 DIAGNOSIS — R531 Weakness: Secondary | ICD-10-CM | POA: Diagnosis not present

## 2017-09-05 DIAGNOSIS — R262 Difficulty in walking, not elsewhere classified: Secondary | ICD-10-CM | POA: Diagnosis not present

## 2017-09-05 DIAGNOSIS — M25561 Pain in right knee: Secondary | ICD-10-CM | POA: Diagnosis not present

## 2017-09-08 DIAGNOSIS — M25561 Pain in right knee: Secondary | ICD-10-CM | POA: Diagnosis not present

## 2017-09-29 DIAGNOSIS — L821 Other seborrheic keratosis: Secondary | ICD-10-CM | POA: Diagnosis not present

## 2017-09-29 DIAGNOSIS — L918 Other hypertrophic disorders of the skin: Secondary | ICD-10-CM | POA: Diagnosis not present

## 2017-09-29 DIAGNOSIS — D1801 Hemangioma of skin and subcutaneous tissue: Secondary | ICD-10-CM | POA: Diagnosis not present

## 2017-09-29 DIAGNOSIS — L814 Other melanin hyperpigmentation: Secondary | ICD-10-CM | POA: Diagnosis not present

## 2017-10-06 DIAGNOSIS — M25561 Pain in right knee: Secondary | ICD-10-CM | POA: Diagnosis not present

## 2017-10-11 DIAGNOSIS — R262 Difficulty in walking, not elsewhere classified: Secondary | ICD-10-CM | POA: Diagnosis not present

## 2017-10-11 DIAGNOSIS — M25561 Pain in right knee: Secondary | ICD-10-CM | POA: Diagnosis not present

## 2017-10-11 DIAGNOSIS — R531 Weakness: Secondary | ICD-10-CM | POA: Diagnosis not present

## 2017-10-11 DIAGNOSIS — M94261 Chondromalacia, right knee: Secondary | ICD-10-CM | POA: Diagnosis not present

## 2017-10-14 DIAGNOSIS — M25561 Pain in right knee: Secondary | ICD-10-CM | POA: Diagnosis not present

## 2017-10-14 DIAGNOSIS — R262 Difficulty in walking, not elsewhere classified: Secondary | ICD-10-CM | POA: Diagnosis not present

## 2017-10-14 DIAGNOSIS — M94261 Chondromalacia, right knee: Secondary | ICD-10-CM | POA: Diagnosis not present

## 2017-10-14 DIAGNOSIS — R531 Weakness: Secondary | ICD-10-CM | POA: Diagnosis not present

## 2017-10-17 DIAGNOSIS — M25561 Pain in right knee: Secondary | ICD-10-CM | POA: Diagnosis not present

## 2017-10-17 DIAGNOSIS — R531 Weakness: Secondary | ICD-10-CM | POA: Diagnosis not present

## 2017-10-17 DIAGNOSIS — M94261 Chondromalacia, right knee: Secondary | ICD-10-CM | POA: Diagnosis not present

## 2017-10-17 DIAGNOSIS — R262 Difficulty in walking, not elsewhere classified: Secondary | ICD-10-CM | POA: Diagnosis not present

## 2017-10-19 DIAGNOSIS — M25561 Pain in right knee: Secondary | ICD-10-CM | POA: Diagnosis not present

## 2017-10-19 DIAGNOSIS — R531 Weakness: Secondary | ICD-10-CM | POA: Diagnosis not present

## 2017-10-19 DIAGNOSIS — R262 Difficulty in walking, not elsewhere classified: Secondary | ICD-10-CM | POA: Diagnosis not present

## 2017-10-19 DIAGNOSIS — M94261 Chondromalacia, right knee: Secondary | ICD-10-CM | POA: Diagnosis not present

## 2017-10-27 DIAGNOSIS — R262 Difficulty in walking, not elsewhere classified: Secondary | ICD-10-CM | POA: Diagnosis not present

## 2017-10-27 DIAGNOSIS — M25561 Pain in right knee: Secondary | ICD-10-CM | POA: Diagnosis not present

## 2017-10-27 DIAGNOSIS — M94261 Chondromalacia, right knee: Secondary | ICD-10-CM | POA: Diagnosis not present

## 2017-10-27 DIAGNOSIS — R531 Weakness: Secondary | ICD-10-CM | POA: Diagnosis not present

## 2017-10-31 DIAGNOSIS — M94261 Chondromalacia, right knee: Secondary | ICD-10-CM | POA: Diagnosis not present

## 2017-10-31 DIAGNOSIS — R262 Difficulty in walking, not elsewhere classified: Secondary | ICD-10-CM | POA: Diagnosis not present

## 2017-10-31 DIAGNOSIS — R531 Weakness: Secondary | ICD-10-CM | POA: Diagnosis not present

## 2017-10-31 DIAGNOSIS — M25561 Pain in right knee: Secondary | ICD-10-CM | POA: Diagnosis not present

## 2017-12-13 ENCOUNTER — Ambulatory Visit (INDEPENDENT_AMBULATORY_CARE_PROVIDER_SITE_OTHER): Payer: BLUE CROSS/BLUE SHIELD

## 2017-12-13 DIAGNOSIS — Z23 Encounter for immunization: Secondary | ICD-10-CM | POA: Diagnosis not present

## 2018-02-09 DIAGNOSIS — K2270 Barrett's esophagus without dysplasia: Secondary | ICD-10-CM | POA: Diagnosis not present

## 2018-02-09 DIAGNOSIS — E669 Obesity, unspecified: Secondary | ICD-10-CM | POA: Diagnosis not present

## 2018-02-09 DIAGNOSIS — K219 Gastro-esophageal reflux disease without esophagitis: Secondary | ICD-10-CM | POA: Diagnosis not present

## 2018-02-09 DIAGNOSIS — Z1211 Encounter for screening for malignant neoplasm of colon: Secondary | ICD-10-CM | POA: Diagnosis not present

## 2018-03-24 DIAGNOSIS — Z1211 Encounter for screening for malignant neoplasm of colon: Secondary | ICD-10-CM | POA: Diagnosis not present

## 2018-03-24 DIAGNOSIS — K635 Polyp of colon: Secondary | ICD-10-CM | POA: Diagnosis not present

## 2018-03-24 DIAGNOSIS — K219 Gastro-esophageal reflux disease without esophagitis: Secondary | ICD-10-CM | POA: Diagnosis not present

## 2018-03-24 DIAGNOSIS — D123 Benign neoplasm of transverse colon: Secondary | ICD-10-CM | POA: Diagnosis not present

## 2018-03-24 DIAGNOSIS — K210 Gastro-esophageal reflux disease with esophagitis: Secondary | ICD-10-CM | POA: Diagnosis not present

## 2018-03-24 DIAGNOSIS — R1310 Dysphagia, unspecified: Secondary | ICD-10-CM | POA: Diagnosis not present

## 2018-03-24 DIAGNOSIS — D125 Benign neoplasm of sigmoid colon: Secondary | ICD-10-CM | POA: Diagnosis not present

## 2018-03-24 DIAGNOSIS — K2270 Barrett's esophagus without dysplasia: Secondary | ICD-10-CM | POA: Diagnosis not present

## 2018-03-24 DIAGNOSIS — K228 Other specified diseases of esophagus: Secondary | ICD-10-CM | POA: Diagnosis not present

## 2018-06-14 DIAGNOSIS — E668 Other obesity: Secondary | ICD-10-CM | POA: Diagnosis not present

## 2018-06-14 DIAGNOSIS — E7849 Other hyperlipidemia: Secondary | ICD-10-CM | POA: Diagnosis not present

## 2018-06-14 DIAGNOSIS — E538 Deficiency of other specified B group vitamins: Secondary | ICD-10-CM | POA: Diagnosis not present

## 2018-06-14 DIAGNOSIS — D126 Benign neoplasm of colon, unspecified: Secondary | ICD-10-CM | POA: Diagnosis not present

## 2018-06-14 DIAGNOSIS — K222 Esophageal obstruction: Secondary | ICD-10-CM | POA: Diagnosis not present

## 2018-06-14 DIAGNOSIS — R7301 Impaired fasting glucose: Secondary | ICD-10-CM | POA: Diagnosis not present

## 2018-12-11 DIAGNOSIS — R82998 Other abnormal findings in urine: Secondary | ICD-10-CM | POA: Diagnosis not present

## 2018-12-11 DIAGNOSIS — E538 Deficiency of other specified B group vitamins: Secondary | ICD-10-CM | POA: Diagnosis not present

## 2018-12-11 DIAGNOSIS — Z0000 Encounter for general adult medical examination without abnormal findings: Secondary | ICD-10-CM | POA: Diagnosis not present

## 2018-12-11 DIAGNOSIS — E7849 Other hyperlipidemia: Secondary | ICD-10-CM | POA: Diagnosis not present

## 2018-12-11 DIAGNOSIS — R7301 Impaired fasting glucose: Secondary | ICD-10-CM | POA: Diagnosis not present

## 2018-12-18 DIAGNOSIS — R0609 Other forms of dyspnea: Secondary | ICD-10-CM | POA: Diagnosis not present

## 2018-12-18 DIAGNOSIS — D126 Benign neoplasm of colon, unspecified: Secondary | ICD-10-CM | POA: Diagnosis not present

## 2018-12-18 DIAGNOSIS — Z0000 Encounter for general adult medical examination without abnormal findings: Secondary | ICD-10-CM | POA: Diagnosis not present

## 2018-12-18 DIAGNOSIS — E7849 Other hyperlipidemia: Secondary | ICD-10-CM | POA: Diagnosis not present

## 2019-05-28 DIAGNOSIS — R591 Generalized enlarged lymph nodes: Secondary | ICD-10-CM | POA: Diagnosis not present

## 2019-05-28 DIAGNOSIS — Z20818 Contact with and (suspected) exposure to other bacterial communicable diseases: Secondary | ICD-10-CM | POA: Diagnosis not present

## 2019-05-28 DIAGNOSIS — J029 Acute pharyngitis, unspecified: Secondary | ICD-10-CM | POA: Diagnosis not present

## 2019-09-25 DIAGNOSIS — H2513 Age-related nuclear cataract, bilateral: Secondary | ICD-10-CM | POA: Diagnosis not present

## 2019-09-25 DIAGNOSIS — H524 Presbyopia: Secondary | ICD-10-CM | POA: Diagnosis not present

## 2019-10-05 DIAGNOSIS — Z23 Encounter for immunization: Secondary | ICD-10-CM | POA: Diagnosis not present

## 2019-12-17 DIAGNOSIS — R7301 Impaired fasting glucose: Secondary | ICD-10-CM | POA: Diagnosis not present

## 2019-12-17 DIAGNOSIS — E7849 Other hyperlipidemia: Secondary | ICD-10-CM | POA: Diagnosis not present

## 2019-12-17 DIAGNOSIS — E538 Deficiency of other specified B group vitamins: Secondary | ICD-10-CM | POA: Diagnosis not present

## 2019-12-17 DIAGNOSIS — Z0000 Encounter for general adult medical examination without abnormal findings: Secondary | ICD-10-CM | POA: Diagnosis not present

## 2019-12-17 DIAGNOSIS — Z125 Encounter for screening for malignant neoplasm of prostate: Secondary | ICD-10-CM | POA: Diagnosis not present

## 2019-12-24 DIAGNOSIS — Z0000 Encounter for general adult medical examination without abnormal findings: Secondary | ICD-10-CM | POA: Diagnosis not present

## 2019-12-24 DIAGNOSIS — D126 Benign neoplasm of colon, unspecified: Secondary | ICD-10-CM | POA: Diagnosis not present

## 2019-12-24 DIAGNOSIS — K2270 Barrett's esophagus without dysplasia: Secondary | ICD-10-CM | POA: Diagnosis not present

## 2019-12-24 DIAGNOSIS — E785 Hyperlipidemia, unspecified: Secondary | ICD-10-CM | POA: Diagnosis not present

## 2019-12-24 DIAGNOSIS — Z1331 Encounter for screening for depression: Secondary | ICD-10-CM | POA: Diagnosis not present

## 2019-12-24 DIAGNOSIS — R7301 Impaired fasting glucose: Secondary | ICD-10-CM | POA: Diagnosis not present

## 2020-06-09 DIAGNOSIS — U071 COVID-19: Secondary | ICD-10-CM | POA: Diagnosis not present

## 2020-06-09 DIAGNOSIS — Z20828 Contact with and (suspected) exposure to other viral communicable diseases: Secondary | ICD-10-CM | POA: Diagnosis not present

## 2020-06-09 DIAGNOSIS — Z1152 Encounter for screening for COVID-19: Secondary | ICD-10-CM | POA: Diagnosis not present

## 2020-06-12 ENCOUNTER — Other Ambulatory Visit: Payer: Self-pay | Admitting: Nurse Practitioner

## 2020-06-12 ENCOUNTER — Telehealth: Payer: Self-pay | Admitting: Nurse Practitioner

## 2020-06-12 DIAGNOSIS — E7800 Pure hypercholesterolemia, unspecified: Secondary | ICD-10-CM

## 2020-06-12 DIAGNOSIS — U071 COVID-19: Secondary | ICD-10-CM

## 2020-06-12 DIAGNOSIS — E669 Obesity, unspecified: Secondary | ICD-10-CM

## 2020-06-12 NOTE — Telephone Encounter (Signed)
Called to discuss with Thomas Church about Covid symptoms and the use of casirivimab/imdevimab, a combination monoclonal antibody infusion for those with mild to moderate Covid symptoms and at a high risk of hospitalization.     Pt is qualified for this infusion at the [facility] infusion center due to co-morbid conditions obesity BMI>30.   Patient verbalized understanding of infusion and appointment details. Scheduled for 06/14/20 @ 1030.    Patient Active Problem List   Diagnosis Date Noted  . Obesity, Class I, BMI 30.0-34.9 (see actual BMI) 02/17/2017  . Pain of left heel 02/17/2017  . Migraine aura without headache 02/16/2016  . H/O umbilical hernia repair Dec 2016 10/30/2015  . Vitamin B12 deficiency 02/12/2015  . Family history of early CAD 10/18/2012  . Encounter for general adult medical examination with abnormal findings 05/15/2012  . Cutaneous skin tags 05/15/2012  . Onychomycosis 05/15/2012  . GERD (gastroesophageal reflux disease) 12/04/2009  . HYPERCHOLESTEROLEMIA 11/24/2007  . ERECTILE DYSFUNCTION 11/24/2007    Willette Alma, AGPCNP-BC

## 2020-06-12 NOTE — Progress Notes (Signed)
I connected by phone with Thomas Church on 06/12/2020 at 11:41 AM to discuss the potential use of a new treatment for mild to moderate COVID-19 viral infection in non-hospitalized patients.  This patient is a 64 y.o. male that meets the FDA criteria for Emergency Use Authorization of COVID monoclonal antibody casirivimab/imdevimab.  Has a (+) direct SARS-CoV-2 viral test result  Has mild or moderate COVID-19   Is NOT hospitalized due to COVID-19  Is within 10 days of symptom onset  Has at least one of the high risk factor(s) for progression to severe COVID-19 and/or hospitalization as defined in EUA.  Specific high risk criteria : BMI > 25   I have spoken and communicated the following to the patient or parent/caregiver regarding COVID monoclonal antibody treatment:  1. FDA has authorized the emergency use for the treatment of mild to moderate COVID-19 in adults and pediatric patients with positive results of direct SARS-CoV-2 viral testing who are 12 years of age and older weighing at least 40 kg, and who are at high risk for progressing to severe COVID-19 and/or hospitalization.  2. The significant known and potential risks and benefits of COVID monoclonal antibody, and the extent to which such potential risks and benefits are unknown.  3. Information on available alternative treatments and the risks and benefits of those alternatives, including clinical trials.  4. Patients treated with COVID monoclonal antibody should continue to self-isolate and use infection control measures (e.g., wear mask, isolate, social distance, avoid sharing personal items, clean and disinfect "high touch" surfaces, and frequent handwashing) according to CDC guidelines.   5. The patient or parent/caregiver has the option to accept or refuse COVID monoclonal antibody treatment.  After reviewing this information with the patient, The patient agreed to proceed with receiving casirivimab\imdevimab infusion and  will be provided a copy of the Fact sheet prior to receiving the infusion. [name] 06/12/2020 11:41 AM

## 2020-06-13 MED ORDER — SODIUM CHLORIDE 0.9 % IV SOLN
1200.0000 mg | Freq: Once | INTRAVENOUS | Status: AC
  Administered 2020-06-14: 1200 mg via INTRAVENOUS
  Filled 2020-06-13: qty 1200

## 2020-06-14 ENCOUNTER — Ambulatory Visit (HOSPITAL_COMMUNITY)
Admission: RE | Admit: 2020-06-14 | Discharge: 2020-06-14 | Disposition: A | Discharge status: Home / Self Care | Payer: BC Managed Care – PPO | Source: Ambulatory Visit | Attending: Pulmonary Disease | Admitting: Pulmonary Disease

## 2020-06-14 DIAGNOSIS — U071 COVID-19: Secondary | ICD-10-CM | POA: Diagnosis not present

## 2020-06-14 DIAGNOSIS — E7800 Pure hypercholesterolemia, unspecified: Secondary | ICD-10-CM | POA: Insufficient documentation

## 2020-06-14 DIAGNOSIS — E669 Obesity, unspecified: Secondary | ICD-10-CM

## 2020-06-14 MED ORDER — FAMOTIDINE IN NACL 20-0.9 MG/50ML-% IV SOLN: 20.0000 mg | Freq: Once | INTRAVENOUS | Status: DC | PRN

## 2020-06-14 MED ORDER — ALBUTEROL SULFATE HFA 108 (90 BASE) MCG/ACT IN AERS: 2.0000 | INHALATION_SPRAY | Freq: Once | RESPIRATORY_TRACT | Status: DC | PRN

## 2020-06-14 MED ORDER — SODIUM CHLORIDE 0.9 % IV SOLN: INTRAVENOUS | Status: DC | PRN

## 2020-06-14 MED ORDER — DIPHENHYDRAMINE HCL 50 MG/ML IJ SOLN: 50.0000 mg | Freq: Once | INTRAMUSCULAR | Status: DC | PRN

## 2020-06-14 MED ORDER — METHYLPREDNISOLONE SODIUM SUCC 125 MG IJ SOLR: 125.0000 mg | Freq: Once | INTRAMUSCULAR | Status: DC | PRN

## 2020-06-14 MED ORDER — EPINEPHRINE 0.3 MG/0.3ML IJ SOAJ: 0.3000 mg | Freq: Once | INTRAMUSCULAR | Status: DC | PRN

## 2020-06-14 NOTE — Discharge Instructions (Signed)

## 2020-06-14 NOTE — Progress Notes (Signed)
  Diagnosis: COVID-19  Physician: Dr Wright  Procedure: [facility] Infusion [facility] Med: casirivimab\imdevimab infusion - Provided patient with casirivimab\imdevimab fact sheet for patients, parents and caregivers prior to infusion.  Complications: No immediate complications noted.  Discharge: Discharged home   Suha Schoenbeck Albarece 06/14/2020   

## 2020-12-18 DIAGNOSIS — E785 Hyperlipidemia, unspecified: Secondary | ICD-10-CM | POA: Diagnosis not present

## 2020-12-18 DIAGNOSIS — Z125 Encounter for screening for malignant neoplasm of prostate: Secondary | ICD-10-CM | POA: Diagnosis not present

## 2020-12-18 DIAGNOSIS — Z0000 Encounter for general adult medical examination without abnormal findings: Secondary | ICD-10-CM | POA: Diagnosis not present

## 2020-12-18 DIAGNOSIS — E538 Deficiency of other specified B group vitamins: Secondary | ICD-10-CM | POA: Diagnosis not present

## 2020-12-18 DIAGNOSIS — R7301 Impaired fasting glucose: Secondary | ICD-10-CM | POA: Diagnosis not present

## 2020-12-25 DIAGNOSIS — Z0000 Encounter for general adult medical examination without abnormal findings: Secondary | ICD-10-CM | POA: Diagnosis not present

## 2020-12-25 DIAGNOSIS — R82998 Other abnormal findings in urine: Secondary | ICD-10-CM | POA: Diagnosis not present

## 2020-12-25 DIAGNOSIS — R7301 Impaired fasting glucose: Secondary | ICD-10-CM | POA: Diagnosis not present

## 2021-09-07 DIAGNOSIS — M25512 Pain in left shoulder: Secondary | ICD-10-CM | POA: Diagnosis not present

## 2022-07-14 DIAGNOSIS — D225 Melanocytic nevi of trunk: Secondary | ICD-10-CM | POA: Diagnosis not present

## 2022-07-14 DIAGNOSIS — L814 Other melanin hyperpigmentation: Secondary | ICD-10-CM | POA: Diagnosis not present

## 2022-07-14 DIAGNOSIS — L821 Other seborrheic keratosis: Secondary | ICD-10-CM | POA: Diagnosis not present

## 2022-07-22 DIAGNOSIS — H524 Presbyopia: Secondary | ICD-10-CM | POA: Diagnosis not present

## 2022-08-27 DIAGNOSIS — H5021 Vertical strabismus, right eye: Secondary | ICD-10-CM | POA: Diagnosis not present

## 2022-12-02 DIAGNOSIS — H524 Presbyopia: Secondary | ICD-10-CM | POA: Diagnosis not present

## 2022-12-02 DIAGNOSIS — H5021 Vertical strabismus, right eye: Secondary | ICD-10-CM | POA: Diagnosis not present

## 2022-12-22 DIAGNOSIS — Z125 Encounter for screening for malignant neoplasm of prostate: Secondary | ICD-10-CM | POA: Diagnosis not present

## 2022-12-22 DIAGNOSIS — E538 Deficiency of other specified B group vitamins: Secondary | ICD-10-CM | POA: Diagnosis not present

## 2022-12-22 DIAGNOSIS — R7989 Other specified abnormal findings of blood chemistry: Secondary | ICD-10-CM | POA: Diagnosis not present

## 2022-12-22 DIAGNOSIS — R7301 Impaired fasting glucose: Secondary | ICD-10-CM | POA: Diagnosis not present

## 2022-12-22 DIAGNOSIS — E785 Hyperlipidemia, unspecified: Secondary | ICD-10-CM | POA: Diagnosis not present

## 2022-12-22 DIAGNOSIS — K219 Gastro-esophageal reflux disease without esophagitis: Secondary | ICD-10-CM | POA: Diagnosis not present

## 2022-12-22 DIAGNOSIS — Z1212 Encounter for screening for malignant neoplasm of rectum: Secondary | ICD-10-CM | POA: Diagnosis not present

## 2023-01-07 DIAGNOSIS — R7301 Impaired fasting glucose: Secondary | ICD-10-CM | POA: Diagnosis not present

## 2023-01-07 DIAGNOSIS — E538 Deficiency of other specified B group vitamins: Secondary | ICD-10-CM | POA: Diagnosis not present

## 2023-01-07 DIAGNOSIS — E785 Hyperlipidemia, unspecified: Secondary | ICD-10-CM | POA: Diagnosis not present

## 2023-01-07 DIAGNOSIS — K2270 Barrett's esophagus without dysplasia: Secondary | ICD-10-CM | POA: Diagnosis not present

## 2023-01-07 DIAGNOSIS — Z0000 Encounter for general adult medical examination without abnormal findings: Secondary | ICD-10-CM | POA: Diagnosis not present

## 2023-01-07 DIAGNOSIS — R82998 Other abnormal findings in urine: Secondary | ICD-10-CM | POA: Diagnosis not present

## 2023-01-25 DIAGNOSIS — K080 Exfoliation of teeth due to systemic causes: Secondary | ICD-10-CM | POA: Diagnosis not present

## 2023-06-16 DIAGNOSIS — K219 Gastro-esophageal reflux disease without esophagitis: Secondary | ICD-10-CM | POA: Diagnosis not present

## 2023-06-16 DIAGNOSIS — K2270 Barrett's esophagus without dysplasia: Secondary | ICD-10-CM | POA: Diagnosis not present

## 2023-06-16 DIAGNOSIS — Z86010 Personal history of colonic polyps: Secondary | ICD-10-CM | POA: Diagnosis not present

## 2023-06-16 DIAGNOSIS — Z1211 Encounter for screening for malignant neoplasm of colon: Secondary | ICD-10-CM | POA: Diagnosis not present

## 2023-08-04 DIAGNOSIS — K080 Exfoliation of teeth due to systemic causes: Secondary | ICD-10-CM | POA: Diagnosis not present

## 2023-11-25 DIAGNOSIS — K2270 Barrett's esophagus without dysplasia: Secondary | ICD-10-CM | POA: Diagnosis not present

## 2023-11-25 DIAGNOSIS — J189 Pneumonia, unspecified organism: Secondary | ICD-10-CM | POA: Diagnosis not present

## 2023-11-25 DIAGNOSIS — R051 Acute cough: Secondary | ICD-10-CM | POA: Diagnosis not present

## 2023-12-21 DIAGNOSIS — R058 Other specified cough: Secondary | ICD-10-CM | POA: Diagnosis not present

## 2023-12-21 DIAGNOSIS — R490 Dysphonia: Secondary | ICD-10-CM | POA: Diagnosis not present

## 2024-01-05 DIAGNOSIS — E538 Deficiency of other specified B group vitamins: Secondary | ICD-10-CM | POA: Diagnosis not present

## 2024-01-05 DIAGNOSIS — Z1212 Encounter for screening for malignant neoplasm of rectum: Secondary | ICD-10-CM | POA: Diagnosis not present

## 2024-01-06 DIAGNOSIS — E785 Hyperlipidemia, unspecified: Secondary | ICD-10-CM | POA: Diagnosis not present

## 2024-01-06 DIAGNOSIS — Z125 Encounter for screening for malignant neoplasm of prostate: Secondary | ICD-10-CM | POA: Diagnosis not present

## 2024-01-06 DIAGNOSIS — R7301 Impaired fasting glucose: Secondary | ICD-10-CM | POA: Diagnosis not present

## 2024-01-12 DIAGNOSIS — R82998 Other abnormal findings in urine: Secondary | ICD-10-CM | POA: Diagnosis not present

## 2024-01-12 DIAGNOSIS — K2270 Barrett's esophagus without dysplasia: Secondary | ICD-10-CM | POA: Diagnosis not present

## 2024-01-12 DIAGNOSIS — Z0000 Encounter for general adult medical examination without abnormal findings: Secondary | ICD-10-CM | POA: Diagnosis not present

## 2024-01-12 DIAGNOSIS — J189 Pneumonia, unspecified organism: Secondary | ICD-10-CM | POA: Diagnosis not present

## 2024-04-09 DIAGNOSIS — K080 Exfoliation of teeth due to systemic causes: Secondary | ICD-10-CM | POA: Diagnosis not present

## 2024-04-17 DIAGNOSIS — R051 Acute cough: Secondary | ICD-10-CM | POA: Diagnosis not present

## 2024-04-17 DIAGNOSIS — K2270 Barrett's esophagus without dysplasia: Secondary | ICD-10-CM | POA: Diagnosis not present

## 2024-07-13 DIAGNOSIS — H5203 Hypermetropia, bilateral: Secondary | ICD-10-CM | POA: Diagnosis not present

## 2024-07-31 DIAGNOSIS — H4911 Fourth [trochlear] nerve palsy, right eye: Secondary | ICD-10-CM | POA: Diagnosis not present

## 2024-07-31 DIAGNOSIS — H532 Diplopia: Secondary | ICD-10-CM | POA: Diagnosis not present

## 2024-09-21 DIAGNOSIS — H4911 Fourth [trochlear] nerve palsy, right eye: Secondary | ICD-10-CM | POA: Diagnosis not present

## 2024-09-21 DIAGNOSIS — H4921 Sixth [abducent] nerve palsy, right eye: Secondary | ICD-10-CM | POA: Diagnosis not present

## 2024-09-24 DIAGNOSIS — H4911 Fourth [trochlear] nerve palsy, right eye: Secondary | ICD-10-CM | POA: Diagnosis not present
